# Patient Record
Sex: Male | Born: 1955 | Race: Black or African American | Hispanic: No | Smoking: Current every day smoker
Health system: Southern US, Community
[De-identification: ages and names within clinical notes are randomized; demographics above are authoritative.]

## PROBLEM LIST (undated history)

## (undated) DIAGNOSIS — I2510 Atherosclerotic heart disease of native coronary artery without angina pectoris: Secondary | ICD-10-CM

## (undated) DIAGNOSIS — I2109 ST elevation (STEMI) myocardial infarction involving other coronary artery of anterior wall: Secondary | ICD-10-CM

## (undated) DIAGNOSIS — E785 Hyperlipidemia, unspecified: Secondary | ICD-10-CM

## (undated) DIAGNOSIS — I10 Essential (primary) hypertension: Secondary | ICD-10-CM

## (undated) HISTORY — DX: ST elevation (STEMI) myocardial infarction involving other coronary artery of anterior wall: I21.09

## (undated) HISTORY — DX: Essential (primary) hypertension: I10

## (undated) HISTORY — PX: NO PAST SURGERIES: SHX2092

## (undated) HISTORY — DX: Atherosclerotic heart disease of native coronary artery without angina pectoris: I25.10

## (undated) HISTORY — DX: Hyperlipidemia, unspecified: E78.5

---

## 2010-11-06 ENCOUNTER — Inpatient Hospital Stay (HOSPITAL_COMMUNITY): Payer: Medicare Other

## 2010-11-06 ENCOUNTER — Inpatient Hospital Stay (HOSPITAL_COMMUNITY)
Admission: EM | Admit: 2010-11-06 | Discharge: 2010-11-10 | DRG: 237 | Disposition: A | Discharge status: Home / Self Care | Payer: Medicare Other | Attending: Interventional Cardiology | Admitting: Interventional Cardiology

## 2010-11-06 DIAGNOSIS — Z7982 Long term (current) use of aspirin: Secondary | ICD-10-CM

## 2010-11-06 DIAGNOSIS — I4901 Ventricular fibrillation: Secondary | ICD-10-CM | POA: Diagnosis not present

## 2010-11-06 DIAGNOSIS — E785 Hyperlipidemia, unspecified: Secondary | ICD-10-CM | POA: Diagnosis present

## 2010-11-06 DIAGNOSIS — J9600 Acute respiratory failure, unspecified whether with hypoxia or hypercapnia: Secondary | ICD-10-CM | POA: Diagnosis present

## 2010-11-06 DIAGNOSIS — S0993XA Unspecified injury of face, initial encounter: Secondary | ICD-10-CM | POA: Diagnosis present

## 2010-11-06 DIAGNOSIS — I2109 ST elevation (STEMI) myocardial infarction involving other coronary artery of anterior wall: Principal | ICD-10-CM | POA: Diagnosis present

## 2010-11-06 DIAGNOSIS — W19XXXA Unspecified fall, initial encounter: Secondary | ICD-10-CM | POA: Diagnosis present

## 2010-11-06 DIAGNOSIS — S199XXA Unspecified injury of neck, initial encounter: Secondary | ICD-10-CM | POA: Diagnosis present

## 2010-11-06 DIAGNOSIS — E46 Unspecified protein-calorie malnutrition: Secondary | ICD-10-CM | POA: Diagnosis not present

## 2010-11-06 DIAGNOSIS — R079 Chest pain, unspecified: Secondary | ICD-10-CM

## 2010-11-06 DIAGNOSIS — F17200 Nicotine dependence, unspecified, uncomplicated: Secondary | ICD-10-CM | POA: Diagnosis present

## 2010-11-06 DIAGNOSIS — R570 Cardiogenic shock: Secondary | ICD-10-CM | POA: Diagnosis present

## 2010-11-06 LAB — POCT I-STAT, CHEM 8
BUN: 6 mg/dL (ref 6–23)
Chloride: 103 mEq/L (ref 96–112)
Potassium: 3.4 mEq/L — ABNORMAL LOW (ref 3.5–5.1)
Sodium: 138 mEq/L (ref 135–145)

## 2010-11-06 LAB — BASIC METABOLIC PANEL
BUN: 7 mg/dL (ref 6–23)
CO2: 21 mEq/L (ref 19–32)
CO2: 21 mEq/L (ref 19–32)
Calcium: 8.6 mg/dL (ref 8.4–10.5)
Chloride: 105 mEq/L (ref 96–112)
Chloride: 105 mEq/L (ref 96–112)
Creatinine, Ser: 0.92 mg/dL (ref 0.50–1.35)
Glucose, Bld: 171 mg/dL — ABNORMAL HIGH (ref 70–99)
Glucose, Bld: 129 mg/dL — ABNORMAL HIGH (ref 70–99)
Potassium: 3.9 mEq/L (ref 3.5–5.1)
Potassium: 3.8 mEq/L (ref 3.5–5.1)
Sodium: 137 mEq/L (ref 135–145)

## 2010-11-06 LAB — RAPID URINE DRUG SCREEN, HOSP PERFORMED: Barbiturates: NOT DETECTED

## 2010-11-06 LAB — GLUCOSE, CAPILLARY
Glucose-Capillary: 117 mg/dL — ABNORMAL HIGH (ref 70–99)
Glucose-Capillary: 114 mg/dL — ABNORMAL HIGH (ref 70–99)

## 2010-11-06 LAB — CARDIAC PANEL(CRET KIN+CKTOT+MB+TROPI)
CK, MB: 52.5 ng/mL (ref 0.3–4.0)
Relative Index: 1.1 (ref 0.0–2.5)
Total CK: 3882 U/L — ABNORMAL HIGH (ref 7–232)
Troponin I: 17.3 ng/mL (ref <0.30)
Troponin I: >25 ng/mL (ref <0.30)

## 2010-11-06 LAB — POCT I-STAT 3, ART BLOOD GAS (G3+)
Acid-base deficit: 10 mmol/L — ABNORMAL HIGH (ref 0.0–2.0)
Bicarbonate: 16 mEq/L — ABNORMAL LOW (ref 20.0–24.0)
Bicarbonate: 18.1 mEq/L — ABNORMAL LOW (ref 20.0–24.0)
Bicarbonate: 22.7 mEq/L (ref 20.0–24.0)
O2 Saturation: 97 %
Patient temperature: 97.3
TCO2: 24 mmol/L (ref 0–100)
pH, Arterial: 7.221 — ABNORMAL LOW (ref 7.350–7.450)
pH, Arterial: 7.434 (ref 7.350–7.450)
pO2, Arterial: 103 mmHg — ABNORMAL HIGH (ref 80.0–100.0)
pO2, Arterial: 85 mmHg (ref 80.0–100.0)
pO2, Arterial: 375 mmHg — ABNORMAL HIGH (ref 80.0–100.0)

## 2010-11-06 LAB — CBC
HCT: 39.2 % (ref 39.0–52.0)
HCT: 40.1 % (ref 39.0–52.0)
Hemoglobin: 14.3 g/dL (ref 13.0–17.0)
Hemoglobin: 14.4 g/dL (ref 13.0–17.0)
MCH: 31.1 pg (ref 26.0–34.0)
MCHC: 36.5 g/dL — ABNORMAL HIGH (ref 30.0–36.0)
MCHC: 36.5 g/dL — ABNORMAL HIGH (ref 30.0–36.0)
MCV: 85.1 fL (ref 78.0–100.0)
MCV: 85.5 fL (ref 78.0–100.0)
Platelets: 333 10*3/uL (ref 150–400)
RBC: 4.63 MIL/uL (ref 4.22–5.81)
RBC: 4.69 MIL/uL (ref 4.22–5.81)
WBC: 11.7 10*3/uL — ABNORMAL HIGH (ref 4.0–10.5)

## 2010-11-06 LAB — HEPATIC FUNCTION PANEL
ALT: 63 U/L — ABNORMAL HIGH (ref 0–53)
AST: 171 U/L — ABNORMAL HIGH (ref 0–37)
Albumin: 3.7 g/dL (ref 3.5–5.2)
Alkaline Phosphatase: 66 U/L (ref 39–117)
Total Protein: 7.3 g/dL (ref 6.0–8.3)

## 2010-11-06 LAB — DIFFERENTIAL
Basophils Relative: 0 % (ref 0–1)
Lymphs Abs: 2.2 10*3/uL (ref 0.7–4.0)
Monocytes Absolute: 1 10*3/uL (ref 0.1–1.0)
Monocytes Relative: 8 % (ref 3–12)
Neutro Abs: 10 10*3/uL — ABNORMAL HIGH (ref 1.7–7.7)

## 2010-11-06 LAB — LIPID PANEL
Cholesterol: 181 mg/dL (ref 0–200)
HDL: 31 mg/dL — ABNORMAL LOW (ref >39)
LDL Cholesterol: 122 mg/dL — ABNORMAL HIGH (ref 0–99)
Total CHOL/HDL Ratio: 5.8 RATIO
Triglycerides: 140 mg/dL (ref <150)
VLDL: 28 mg/dL (ref 0–40)

## 2010-11-06 LAB — CK TOTAL AND CKMB (NOT AT ARMC): Total CK: 214 U/L (ref 7–232)

## 2010-11-06 LAB — PROTIME-INR: Prothrombin Time: 14.2 seconds (ref 11.6–15.2)

## 2010-11-06 LAB — ABO/RH: ABO/RH(D): B POS

## 2010-11-06 LAB — TYPE AND SCREEN

## 2010-11-06 LAB — MRSA PCR SCREENING: MRSA by PCR: NEGATIVE

## 2010-11-06 LAB — PHOSPHORUS: Phosphorus: 3.1 mg/dL (ref 2.3–4.6)

## 2010-11-06 LAB — LACTIC ACID, PLASMA: Lactic Acid, Venous: 1.3 mmol/L (ref 0.5–2.2)

## 2010-11-07 ENCOUNTER — Inpatient Hospital Stay (HOSPITAL_COMMUNITY): Payer: Medicare Other

## 2010-11-07 DIAGNOSIS — I219 Acute myocardial infarction, unspecified: Secondary | ICD-10-CM

## 2010-11-07 DIAGNOSIS — R570 Cardiogenic shock: Secondary | ICD-10-CM

## 2010-11-07 DIAGNOSIS — J9600 Acute respiratory failure, unspecified whether with hypoxia or hypercapnia: Secondary | ICD-10-CM

## 2010-11-07 DIAGNOSIS — E119 Type 2 diabetes mellitus without complications: Secondary | ICD-10-CM

## 2010-11-07 LAB — BASIC METABOLIC PANEL
BUN: 7 mg/dL (ref 6–23)
CO2: 22 mEq/L (ref 19–32)
Chloride: 107 mEq/L (ref 96–112)
Creatinine, Ser: 1.13 mg/dL (ref 0.50–1.35)
GFR calc Af Amer: >60 mL/min (ref >60)
Potassium: 3.2 mEq/L — ABNORMAL LOW (ref 3.5–5.1)

## 2010-11-07 LAB — GLUCOSE, CAPILLARY
Glucose-Capillary: 117 mg/dL — ABNORMAL HIGH (ref 70–99)
Glucose-Capillary: 121 mg/dL — ABNORMAL HIGH (ref 70–99)
Glucose-Capillary: 123 mg/dL — ABNORMAL HIGH (ref 70–99)
Glucose-Capillary: 104 mg/dL — ABNORMAL HIGH (ref 70–99)
Glucose-Capillary: 106 mg/dL — ABNORMAL HIGH (ref 70–99)
Glucose-Capillary: 87 mg/dL (ref 70–99)

## 2010-11-07 LAB — CBC
HCT: 37.3 % — ABNORMAL LOW (ref 39.0–52.0)
Hemoglobin: 13.6 g/dL (ref 13.0–17.0)
MCV: 85 fL (ref 78.0–100.0)
RBC: 4.39 MIL/uL (ref 4.22–5.81)
WBC: 12.8 10*3/uL — ABNORMAL HIGH (ref 4.0–10.5)

## 2010-11-07 LAB — CARDIAC PANEL(CRET KIN+CKTOT+MB+TROPI)
Relative Index: 0.6 (ref 0.0–2.5)
Troponin I: >25 ng/mL (ref <0.30)

## 2010-11-08 LAB — BASIC METABOLIC PANEL
CO2: 22 mEq/L (ref 19–32)
Chloride: 102 mEq/L (ref 96–112)
Creatinine, Ser: 1.09 mg/dL (ref 0.50–1.35)
Glucose, Bld: 90 mg/dL (ref 70–99)
Sodium: 134 mEq/L — ABNORMAL LOW (ref 135–145)

## 2010-11-08 LAB — CBC
Hemoglobin: 14.3 g/dL (ref 13.0–17.0)
MCV: 86.6 fL (ref 78.0–100.0)
Platelets: 279 10*3/uL (ref 150–400)
RBC: 4.62 MIL/uL (ref 4.22–5.81)
WBC: 11.2 10*3/uL — ABNORMAL HIGH (ref 4.0–10.5)

## 2010-11-09 LAB — BASIC METABOLIC PANEL
BUN: 9 mg/dL (ref 6–23)
CO2: 22 mEq/L (ref 19–32)
Glucose, Bld: 116 mg/dL — ABNORMAL HIGH (ref 70–99)
Potassium: 3.6 mEq/L (ref 3.5–5.1)
Sodium: 137 mEq/L (ref 135–145)

## 2010-11-11 NOTE — Cardiovascular Report (Signed)
NAMEMarland Kitchen  Scott Hobbs, Scott Hobbs NO.:  [id number]  MEDICAL RECORD NO.:  [id number]  LOCATION:  2909                         FACILITY:  [facility]  PHYSICIAN:  Corky Crafts, MDDATE OF BIRTH:  Jun 27, 1955  DATE OF PROCEDURE:  11/06/2010 DATE OF DISCHARGE:                           CARDIAC CATHETERIZATION   PROCEDURE PERFORMED:  Coronary angiogram, percutaneous coronary intervention of the left anterior descending, abdominal aortogram, and intraaortic balloon pump placement.  OPERATOR:  Corky Crafts, MD  INDICATIONS:  Anterior ST-elevation MI with cardiogenic shock and VF arrest.  PROCEDURE NARRATIVE:  The patient was brought emergently to the cath lab.  He was prepped and draped in the usual sterile fashion.  His right groin was infiltrated with 1% lidocaine.  A 6-French sheath was placed into the right common femoral artery using modified Seldinger technique. Right coronary angiography was performed using a JR-4.0 catheter.  The catheter was advanced to vessel ostium under fluoroscopic guidance. Digital angiography was performed in multiple projections using hand injection of contrast.  Left coronary artery angiography was performed using a CLS 3.5 guiding catheter in a similar fashion.  The angioplasty was performed.  Please see below for details.  An abdominal aortogram was performed after the left heart catheterization was performed using a pigtail catheter.  The pigtail catheter was pulled back from the left ventricle under continuous hemodynamic pressure monitoring.  After the abdominal aortogram and intra-aortic balloon pump was placed, the patient was then moved to the CCU.  A venous line was also placed for additional IV access.  FINDINGS:  The left main was widely patent. The right coronary artery was a medium-sized vessel and widely patent. The left circumflex was a large codominant vessel.  There was an OM-1 and OM-2 which were both large and all  appeared angiographically normal. The left anterior descending was occluded proximally.  After reperfusion, it was noted that there was a medium-sized diagonal and the remainder of the LAD was widely patent.  INTERVENTIONAL NARRATIVE:  A CLS 3.5 guiding catheter was used.  A [place] wire was placed down the LAD.  A 2.5 x 15 Emerge balloon was used to predilate the lesion.  Aspiration thrombectomy had been performed prior to the balloon dilatation.  Several passes were made with some improvement in the angiographic appearance.  A 3.0 x 23 Vision stent was then placed and deployed across the diseased area in the proximal LAD.  A 3.75 x 15 [place] Quantum balloon was then used to post dilate the stent, inflated to 20 atmospheres twice both in the distal and proximal part of the stent.  An intraaortic balloon pump was placed due to the fact that the patient had significant hemodynamic issues and rhythm issues.  Prior to the catheterization, the patient developed several episodes of ventricular fibrillation.  He was shocked 5 times.  He was not sedated at that point and he was trying to get up off the table every time he was shocked.  The final time he was shocked he fell off the table and had to be held back.  His C-spine was held and stabilized in place with a collar.  Ultimately, Anesthesia was called to  intubate the patient. Because of this, there was a delay in reperfusion.  He required high doses of sedation as well during the procedure.  He tried to move several times when sedation would wear off.  Eventually, Versed and fentanyl drip were started for sedation.  IMPRESSION: 1. Acute anterior ST-elevation myocardial infarction complicated by     cardiogenic shock and ventricular fibrillation arrest due to     occluded proximal left anterior descending. 2. Successful bare-metal stent placement to the proximal left anterior     descending with a 3.0 x 23 Vision stent postdilated to 3.9 mm  in     diameter. 3. Amiodarone drip due to the patient's arrhythmia.  RECOMMENDATIONS:  Continue intraaortic balloon pump at one-to-one.  He will need ventilatory support.  We will get a Pulmonary consult for that.  Continue aspirin and Effient.  We will continue Angiomax until he has got Effient bolus.  He will be watched in the CCU.  He will need aggressive secondary prevention and smoking cessation.     Corky Crafts, MD     JSV/MEDQ  D:  11/06/2010  T:  11/06/2010  Job:  [id number]  Electronically Signed by Lance Muss MD on 11/11/2010 01:09:09 PM

## 2010-11-11 NOTE — Discharge Summary (Signed)
  NAMEMarland Kitchen  Hobbs, Scott NO.:  [id number]  MEDICAL RECORD NO.:  [id number]  LOCATION:  2001                         FACILITY:  [facility]  PHYSICIAN:  Corky Crafts, MDDATE OF BIRTH:  03/20/1955  DATE OF ADMISSION:  11/06/2010 DATE OF DISCHARGE:  11/10/2010                              DISCHARGE SUMMARY   FINAL DIAGNOSES: 1. Acute anterior ST-elevation myocardial infarction. 2. Smoking. 3. Hyperlipidemia.  PROCEDURES PERFORMED:  Cardiac catheterization with stent placement to the LAD for acute anterior MI.  This was complicated by VF arrest.  The patient required intubation and subsequent balloon pump placement.  HOSPITAL COURSE:  The patient was admitted with chest discomfort.  Upon arrival to the cath lab, he had VF arrest.  He was shocked multiple times and eventually intubated.  We subsequently did his cardiac cath, which revealed an occluded LAD.  He was successfully stented.  He was watched in the ICU for several days.  He did well.  He was on IV amiodarone for sometime due to runs of nonsustained ventricular tachycardia.  After a few days his rhythm stabilized.  He had an echocardiogram showing an ejection fraction of 30% to 35% with corresponding wall motion abnormality to his occluded LAD.  He was not in any type of heart failure.  He was walking in the halls without any difficulty.  It was noted that because he is on Medicaid, it will be difficult for him to get Effient, therefore samples of Effient were given to him.  He also had a coupon for three 30-day supply.  Samples of Crestor were also given to the patient.  Of note while he was in the hospital, he also had a CT of his neck to clear his C-spine as he had fallen from the cath table prior to the procedure starting due to multiple defibrillation.  DISCHARGE MEDICATIONS: 1. Prasugrel 10 mg daily. 2. Rosuvastatin 40 mg daily. 3. Carvedilol 3.125 mg p.o. b.i.d. 4. Cepacol throat lozenges as  needed. 5. Aspirin 325 mg daily. 6. Lisinopril 5 mg p.o. daily.  ACTIVITY:  No lifting more than 10 pounds for at least a week.  No driving until he is seen in the office.  DIET:  Low-sodium heart-healthy diet.  Appointments with Dr. Eldridge Dace on November 14, 2010, at 9 a.m.  SPECIAL INSTRUCTIONS:  He is told to stop smoking.  Of note, he did admit to smoking marijuana, which he agrees to stop.  He will need lab work checked due to starting an ACE inhibitor for his LV dysfunction.     Corky Crafts, MD     JSV/MEDQ  D:  11/10/2010  T:  11/10/2010  Job:  [id number]  Electronically Signed by Lance Muss MD on 11/11/2010 01:10:22 PM

## 2010-12-09 NOTE — H&P (Signed)
NAMEMarland Kitchen  KEYVIN, RISON NO.:  [id number]  MEDICAL RECORD NO.:  [id number]  LOCATION:  2909                         FACILITY:  [facility]  PHYSICIAN:  Natasha Bence, MD       DATE OF BIRTH:  [birth year]  DATE OF ADMISSION:  11/06/2010 DATE OF DISCHARGE:                             HISTORY & PHYSICAL   CHIEF COMPLAINTS:  Chest pain.  HISTORY OF PRESENT ILLNESS:  The patient presented to the emergency department via EMS with acute chest pain, shortness of breath that began this evening.  History is limited as the patient is intubated.  The patient was found to have anterior ST elevation, taken to the catheterization lab where he was found to have an occluded proximal LAD prior.  As he has been transferred over for catheterization,  he had 6 episodes of ventricular fibrillation which were successfully defibrillated.  His LAD was stented with a bare metal stent.  Of note, after one of the defibrillation episodes, the patient was disoriented and slightly combative and fell off the catheterization table.  He fell on his elbow and bottom with no obvious head trauma.  REVIEW OF SYSTEMS:  Unable to be obtained as the patient is intubated.  PAST MEDICAL HISTORY:  Unknown.  PAST SURGICAL HISTORY:  Unknown.  FAMILY HISTORY:  Unknown.  SOCIAL HISTORY:  Unknown.  CURRENT MEDICATIONS:  Unknown.  ALLERGIES:  Unknown.  PHYSICAL EXAMINATION:  VITAL SIGNS:  He is afebrile.  Blood pressure 117/75, heart rate of 78, respiratory rate of 23, O2 saturations100%. GENERAL:  He is intubated, no apparent distress. EYES:  He has anicteric sclerae.  Pupils equal, round, reactive to light. HEAD:  Atraumatic. ENT:  Membranes are moist.  He has an ET tube in place. NECK:  Normal jugular venous pressure.  No carotid bruits. LUNGS:  Clear to auscultation bilaterally. CARDIOVASCULAR:  He has a regular rate and rhythm.  No murmurs, rubs, or gallops. ABDOMEN:  Soft, nontender,  nondistended. EXTREMITIES:  Slightly cool, symmetrical pulses throughout.  LABORATORY DATA:  Sodium was 138, potassium was 3.4, chloride was 114, BUN 6, creatinine was 1.5, glucose was 176.  Troponin was 0.36. Hematocrit was 40, white count was 11.7, platelet count was 330.  INR was 1.08.  PT was 14.2.  EKG initially showed sinus mechanism with anterolateral and inferior ST elevation.  IMPRESSION AND PLAN:  [age]-year-old black male with an anterior ST elevation myocardial infarction who presented in cardiogenic shock and ventricular fibrillation arrest.  He is currently hemodynamically stable status post a bare metal stent to his LAD.  He is currently intubated, but should be able to be extubated in the near future.  We will continue aspirin and prasugrel.  We will start him on a statin with Lipitor.  We will add beta-blocker and ACE inhibitor after his blood pressure has been stable for more time.  Currently, he is on amiodarone infusion due to his VF arrest.  We will likely be able to stop this and would need to replace his potassium as he is hypokalemic.  We will obtain an echocardiogram in the morning for further risk stratification.  Due to his fall from the cath  lab table, we will check x-rays to make sure he has not sustained any injuries there.          ______________________________ Natasha Bence, MD     MH/MEDQ  D:  11/06/2010  T:  11/06/2010  Job:  [id number]  Electronically Signed by Natasha Bence MD on 12/09/2010 11:40:58 AM

## 2012-11-12 ENCOUNTER — Other Ambulatory Visit: Payer: Self-pay | Admitting: Interventional Cardiology

## 2012-12-07 ENCOUNTER — Encounter: Payer: Self-pay | Admitting: *Deleted

## 2012-12-07 ENCOUNTER — Encounter: Payer: Self-pay | Admitting: Interventional Cardiology

## 2012-12-09 ENCOUNTER — Ambulatory Visit: Payer: Medicare Other | Admitting: Interventional Cardiology

## 2012-12-23 ENCOUNTER — Ambulatory Visit: Payer: Medicare Other | Admitting: Interventional Cardiology

## 2012-12-27 ENCOUNTER — Encounter: Payer: Self-pay | Admitting: Interventional Cardiology

## 2013-01-07 ENCOUNTER — Other Ambulatory Visit: Payer: Self-pay | Admitting: Interventional Cardiology

## 2013-01-16 ENCOUNTER — Other Ambulatory Visit: Payer: Self-pay | Admitting: Interventional Cardiology

## 2013-01-17 IMAGING — CT CT CERVICAL SPINE W/O CM
3 of 6 series · 9 of 28 positions shown, 11 images · non-contrast
Comparison: None available.

CT HEAD

CLINICAL DATA: Trauma to head.  Possible injury.

CT HEAD WITHOUT CONTRAST
CT CERVICAL SPINE WITHOUT CONTRAST
TECHNIQUE: Multidetector CT imaging of the head and cervical spine
was performed following the standard protocol without intravenous
contrast.  Multiplanar CT image reconstructions of the cervical
spine were also generated.

[Series 5: recon 2: c-spine · axial · 0.23mm/px · z∈[-253,-188]mm · 2 of 78 slices shown, 3 images]
[im 26/78  soft-tissue]
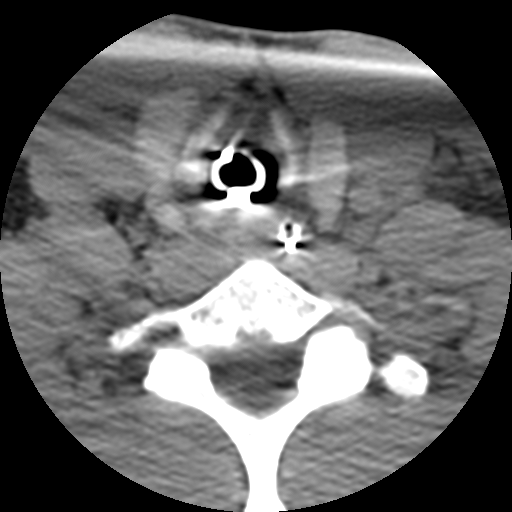
[im 26/78  bone]
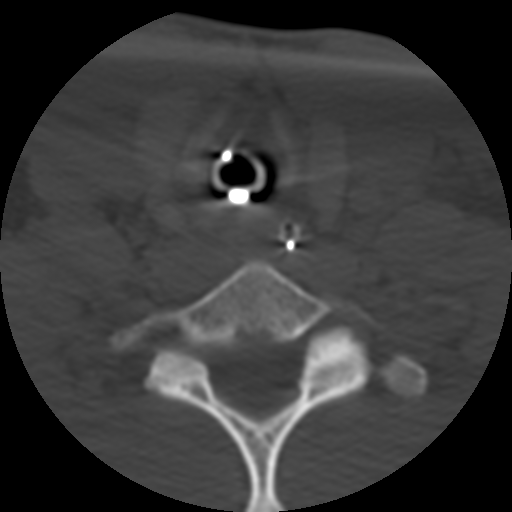
[im 52/78  bone]
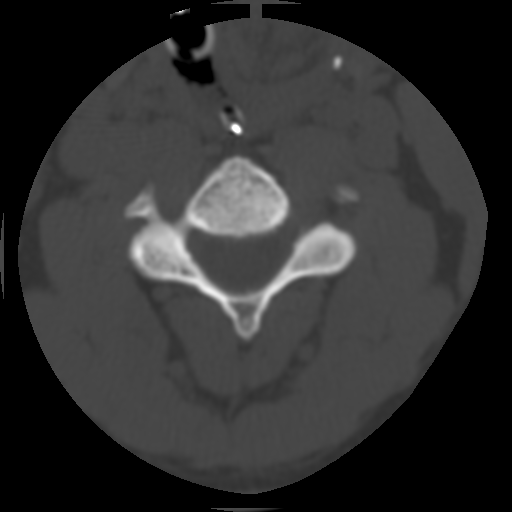

[Series 601: coronal · coronal · 0.43mm/px · 5 of 44 slices shown, 6 images]
[im 15/44  bone]
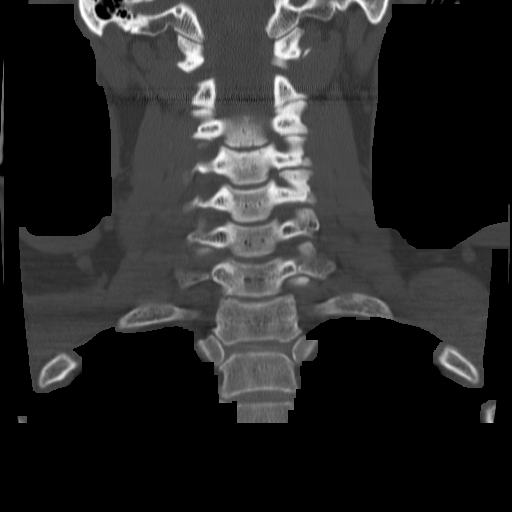
[im 18/44  bone]
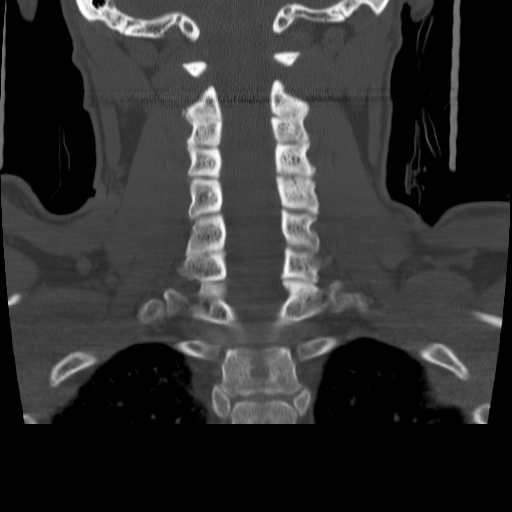
[im 22/44  soft-tissue]
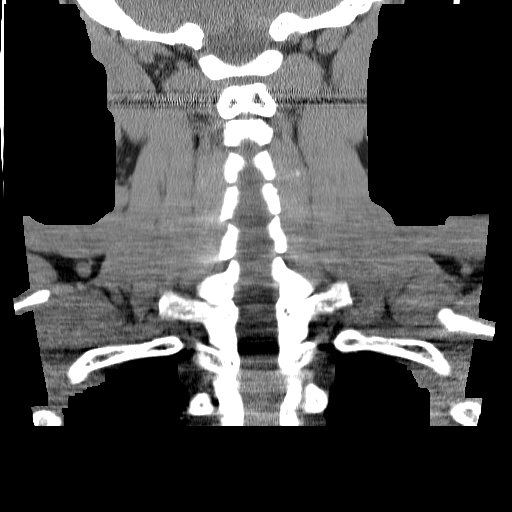
[im 22/44  bone]
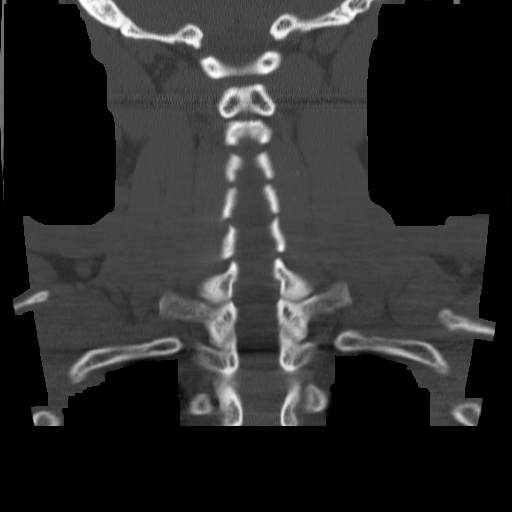
[im 26/44  bone]
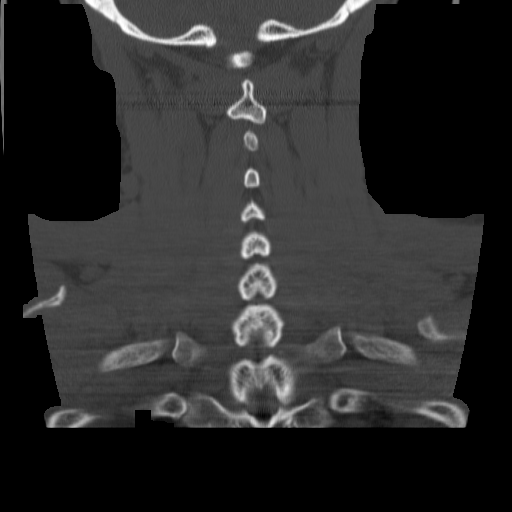
[im 29/44  bone]
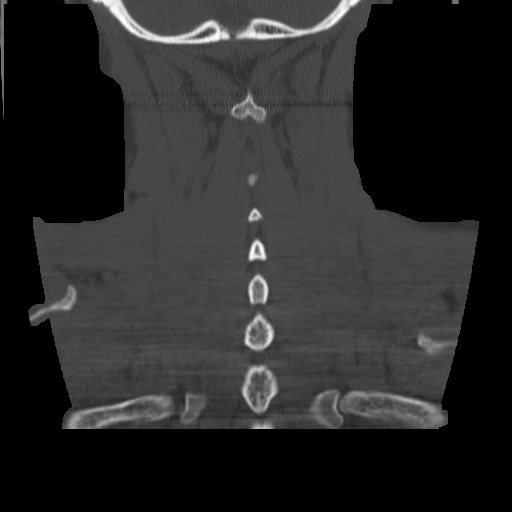

[Series 602: orthogonal · axial · 0.27mm/px · z∈[-259,-201]mm · 2 of 96 slices shown]
[im 32/96  bone]
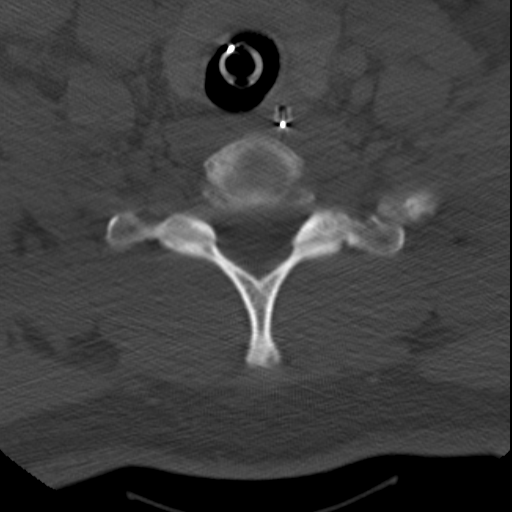
[im 64/96  bone]
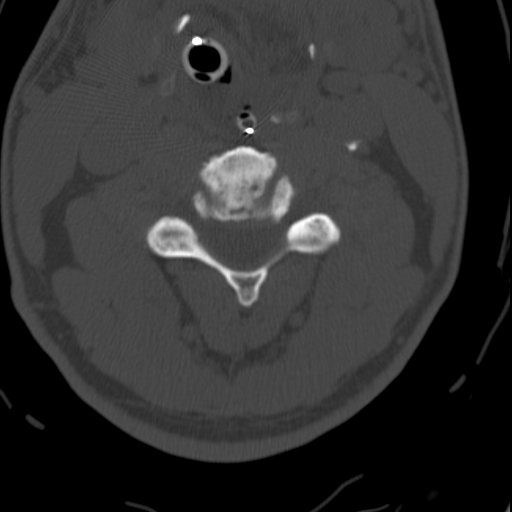

[9 of 28 positions shown; findings below may reference images not displayed]

FINDINGS: No acute cortical infarct, hemorrhage, or mass lesion is
present.  No significant extracranial soft tissue injury is
evident. The ventricles are of normal size.  No significant extra-
axial fluid collection is present.  The paranasal sinuses and
mastoid air cells are clear.  The osseous skull is intact.
Atherosclerotic calcifications are noted within the cavernous
carotid arteries.
IMPRESSION: Negative CT of the head.

CT CERVICAL SPINE
FINDINGS: The cervical spine is imaged from the skull base through
T2-3.  There is straightening of the normal cervical lordosis,
likely positional as the patient is in a hard collar.  The patient
is intubated.  An NG tube is in place.

No acute fracture or traumatic subluxation is evident.

Endplate sclerotic plate changes and anterior osteophytes are
prominent at C3-4.  There is uncovertebral disease at this level
with mild osseous foraminal narrowing, worse on the left.

C4-5:  Mild left-sided osseous foraminal narrowing is secondary to
facet hypertrophy.

No other significant osseous foraminal narrowing is evident.

The soft tissues are otherwise unremarkable.
IMPRESSION: 1.  No acute fracture or traumatic subluxation.
2.  Mild spondylosis of the cervical spine is most prominent at C3-
4 with mild bilateral foraminal narrowing and left-sided foraminal
narrowing at C4-5 secondary to facet hypertrophy.
3.  The patient is intubated with an NG tube in place. The hard
collar is in place as well.

## 2013-01-21 ENCOUNTER — Other Ambulatory Visit: Payer: Self-pay | Admitting: Interventional Cardiology

## 2013-03-23 ENCOUNTER — Other Ambulatory Visit: Payer: Self-pay | Admitting: Cardiology

## 2013-03-23 MED ORDER — CLOPIDOGREL BISULFATE 75 MG PO TABS: ORAL_TABLET | ORAL | Status: DC

## 2013-03-23 MED ORDER — CARVEDILOL 3.125 MG PO TABS: ORAL_TABLET | ORAL | Status: DC

## 2013-04-21 ENCOUNTER — Encounter: Payer: Self-pay | Admitting: Interventional Cardiology

## 2013-04-27 ENCOUNTER — Other Ambulatory Visit: Payer: Self-pay | Admitting: Cardiology

## 2013-04-27 MED ORDER — CARVEDILOL 3.125 MG PO TABS: ORAL_TABLET | ORAL | Status: DC

## 2013-04-27 MED ORDER — CLOPIDOGREL BISULFATE 75 MG PO TABS: ORAL_TABLET | ORAL | Status: DC

## 2013-04-27 NOTE — Addendum Note (Signed)
Addended byOrlene Plum: Kahleb Mcclane H on: 04/27/2013 08:41 AM   Modules accepted: Orders

## 2013-06-14 ENCOUNTER — Encounter: Payer: Self-pay | Admitting: Interventional Cardiology

## 2013-06-14 ENCOUNTER — Ambulatory Visit (INDEPENDENT_AMBULATORY_CARE_PROVIDER_SITE_OTHER): Payer: Medicare Other | Admitting: Interventional Cardiology

## 2013-06-14 ENCOUNTER — Encounter (INDEPENDENT_AMBULATORY_CARE_PROVIDER_SITE_OTHER): Payer: Self-pay

## 2013-06-14 VITALS — BP 119/77 | HR 91 | Ht 72.0 in | Wt 187.0 lb

## 2013-06-14 DIAGNOSIS — E782 Mixed hyperlipidemia: Secondary | ICD-10-CM

## 2013-06-14 DIAGNOSIS — I2510 Atherosclerotic heart disease of native coronary artery without angina pectoris: Secondary | ICD-10-CM | POA: Insufficient documentation

## 2013-06-14 DIAGNOSIS — I252 Old myocardial infarction: Secondary | ICD-10-CM | POA: Insufficient documentation

## 2013-06-14 DIAGNOSIS — F17200 Nicotine dependence, unspecified, uncomplicated: Secondary | ICD-10-CM | POA: Insufficient documentation

## 2013-06-14 MED ORDER — NITROGLYCERIN 0.4 MG SL SUBL: 0.4000 mg | SUBLINGUAL_TABLET | SUBLINGUAL | Status: DC | PRN

## 2013-06-14 MED ORDER — CARVEDILOL 3.125 MG PO TABS: ORAL_TABLET | ORAL | Status: DC

## 2013-06-14 NOTE — Progress Notes (Signed)
Patient ID: Scott Hobbs, male   DOB: 1956/01/19, 58 y.o.   MRN: [id number]    [address] [place], [place]  [address] Phone: [phone] Fax:  [phone]  Date:  06/14/2013   ID:  Scott Hobbs, DOB 1956/01/19, MRN [id number]  PCP:  No primary provider on file.      History of Present Illness: Scott Hobbs is a 58 y.o. male who had an anterior MI in 2012 complicated by VF arrest.No discomfort in his chest that was similar to what he had before his heart attack. It lasted 30 minutes while lying down and it resolved after sitting up and drinking some water. He did not use NTG. He exercises regularly. He was jogging 2 miles and then in the afternoon , he walks. No discomfort with exercising.  He had surgery on his ankle in 2013 for a ruptured blood vessel after trauma. [place]. No problems with the surgery.  [facility] He has improved. He continues to deny any cardiac symptoms. He is now riding a bike as well.    Wt Readings from Last 3 Encounters:  06/14/13 187 lb (84.823 kg)     Past Medical History  Diagnosis Date  . Hyperlipidemia   . Acute myocardial infarction of other anterior wall, subsequent episode of care   . HTN (hypertension)   . CAD (coronary artery disease)     Current Outpatient Prescriptions  Medication Sig Dispense Refill  . aspirin 325 MG tablet Take 325 mg by mouth daily.      Marland Kitchen. atorvastatin (LIPITOR) 40 MG tablet TAKE 1 TABLET BY MOUTH EVERY DAY *PT NEEDS APPT*  15 tablet  0  . carvedilol (COREG) 3.125 MG tablet TAKE 1 TABLET TWICE A DAY  60 tablet  1  . clopidogrel (PLAVIX) 75 MG tablet TAKE 1 TABLET BY MOUTH EVERY DAY  30 tablet  1  . lisinopril (PRINIVIL,ZESTRIL) 5 MG tablet TAKE 1 TABLET BY MOUTH EVERY DAY *PT NEEDS APPT*  15 tablet  0  . nitroGLYCERIN (NITROSTAT) 0.4 MG SL tablet Place 0.4 mg under the tongue every 5 (five) minutes as needed for chest pain.        No current facility-administered medications for this visit.    Allergies:   No Known Allergies  Social History:  The patient  reports that he has been smoking Cigarettes.  He has been smoking about 0.50 packs per day. He does not have any smokeless tobacco history on file. He reports that he drinks alcohol. He reports that he uses illicit drugs.   Family History:  The patient's family history is not on file.   ROS:  Please see the history of present illness.  No nausea, vomiting.  No fevers, chills.  No focal weakness.  No dysuria.    All other systems reviewed and negative.   PHYSICAL EXAM: VS:  BP 119/77  Pulse 91  Ht 6' (1.829 m)  Wt 187 lb (84.823 kg)  BMI 25.36 kg/m2 Well nourished, well developed, in no acute distress HEENT: normal Neck: no JVD, no carotid bruits Cardiac:  normal S1, S2; RRR;  Lungs:  clear to auscultation bilaterally, no wheezing, rhonchi or rales Abd: soft, nontender, no hepatomegaly Ext: no edema Skin: warm and dry Neuro:   no focal abnormalities noted  EKG:  Normal sinus rhythm, inferior Q waves, early repolarization, PAC, no change     ASSESSMENT  AND PLAN:  1.CAD in native artery  Start Nitroglycerin 0.4 mg tablet, 0.4 mg, 1 tablet as directed, SL, as directed prn chest pain, 1 vial, Refills 6 Continue Aspirin Tablet, 325 MG, 1 tablet, Orally, Once a day Continue Clopidogrel Bisulfate Tablet, 75 MG, 1 tablet, Orally, Once a day continue aggressive secondary prevention. He had a bare-metal stent to the LAD at the time of his acute anterior MI. He has been reliable with his Plavix. If a drug-eluting stent was needed in the future, I think he would be a good candidate.   Low risk stress test in July 2013. He had normal LV function. There was a mild defect in the basilar and mid inferoseptal/inferior wall. He has no symptoms. Angiography is not indicated.   2.Combined hyperlipidemia  Continue Atorvastatin Calcium Tablet, 40 MG, 1 tablet, Orally,  Once a day LDL well-controlled in 11/14, LDL 79. His HDL is only 29.  3. MI, Old  Continue Carvedilol Tablet, 3.125 MG, 1 tablet with food, Orally, Twice a day No evidence of congestive heart failure.   4.  tobacco abuse: He needs to stop smoking. Signed, Fredric MareJay S. Blair Lundeen, MD, [facility] 06/14/2013 3:43 PM

## 2013-06-14 NOTE — Patient Instructions (Signed)
Your physician recommends that you continue on your current medications as directed. Please refer to the Current Medication list given to you today.  Your physician wants you to follow-up in: 1 year with Dr. Varanasi. You will receive a reminder letter in the mail two months in advance. If you don't receive a letter, please call our office to schedule the follow-up appointment.  

## 2013-12-07 ENCOUNTER — Other Ambulatory Visit: Payer: Self-pay | Admitting: *Deleted

## 2013-12-07 MED ORDER — LISINOPRIL 5 MG PO TABS: ORAL_TABLET | ORAL | Status: DC

## 2013-12-07 MED ORDER — CARVEDILOL 3.125 MG PO TABS: ORAL_TABLET | ORAL | Status: DC

## 2013-12-07 MED ORDER — CLOPIDOGREL BISULFATE 75 MG PO TABS: ORAL_TABLET | ORAL | Status: DC

## 2013-12-07 MED ORDER — ATORVASTATIN CALCIUM 40 MG PO TABS: ORAL_TABLET | ORAL | Status: DC

## 2014-07-13 ENCOUNTER — Ambulatory Visit: Payer: Medicare Other | Admitting: Interventional Cardiology

## 2014-09-07 ENCOUNTER — Encounter: Payer: Self-pay | Admitting: Interventional Cardiology

## 2014-09-07 ENCOUNTER — Ambulatory Visit (INDEPENDENT_AMBULATORY_CARE_PROVIDER_SITE_OTHER): Payer: Medicare Other | Admitting: Interventional Cardiology

## 2014-09-07 VITALS — BP 144/88 | HR 61 | Ht 72.0 in | Wt 184.0 lb

## 2014-09-07 DIAGNOSIS — Z720 Tobacco use: Secondary | ICD-10-CM | POA: Diagnosis not present

## 2014-09-07 DIAGNOSIS — I2510 Atherosclerotic heart disease of native coronary artery without angina pectoris: Secondary | ICD-10-CM | POA: Diagnosis not present

## 2014-09-07 DIAGNOSIS — I10 Essential (primary) hypertension: Secondary | ICD-10-CM | POA: Diagnosis not present

## 2014-09-07 DIAGNOSIS — F17200 Nicotine dependence, unspecified, uncomplicated: Secondary | ICD-10-CM

## 2014-09-07 DIAGNOSIS — I252 Old myocardial infarction: Secondary | ICD-10-CM

## 2014-09-07 MED ORDER — LISINOPRIL 10 MG PO TABS: ORAL_TABLET | ORAL | Status: DC

## 2014-09-07 MED ORDER — ASPIRIN EC 81 MG PO TBEC: 81.0000 mg | DELAYED_RELEASE_TABLET | Freq: Every day | ORAL | Status: DC

## 2014-09-07 NOTE — Progress Notes (Signed)
Patient ID: Scott Hobbs, male   DOB: 08-May-1955, 59 y.o.   MRN: [id number]     Cardiology Office Note   Date:  09/07/2014   ID:  [name], DOB 09-28-1955, MRN [id number]  PCP:  Ailene Ravel, MD    No chief complaint on file.    Wt Readings from Last 3 Encounters:  09/07/14 184 lb (83.462 kg)  06/14/13 187 lb (84.823 kg)       History of Present Illness: Scott Hobbs is a 59 y.o. male  who had an anterior MI in 2012 complicated by VF arrest.  He received an LAD bare metal stent 3.0 x 23 mm at that time.  No discomfort in his chest that was similar to what he had before his heart attack.  He exercises regularly riding a bike 10 miles daily. No NTG use. No discomfort with exercising. No syncope.    Past Medical History  Diagnosis Date  . Hyperlipidemia   . Acute myocardial infarction of other anterior wall, subsequent episode of care   . HTN (hypertension)   . CAD (coronary artery disease)     Past Surgical History  Procedure Laterality Date  . No past surgeries       Current Outpatient Prescriptions  Medication Sig Dispense Refill  . aspirin 325 MG tablet Take 325 mg by mouth daily.    Marland Kitchen atorvastatin (LIPITOR) 40 MG tablet TAKE 1 TABLET BY MOUTH EVERY DAY 30 tablet 11  . carvedilol (COREG) 3.125 MG tablet TAKE 1 TABLET TWICE A DAY 60 tablet 11  . clopidogrel (PLAVIX) 75 MG tablet TAKE 1 TABLET BY MOUTH EVERY DAY 30 tablet 11  . INVEGA SUSTENNA 117 MG/0.75ML SUSP     . lisinopril (PRINIVIL,ZESTRIL) 5 MG tablet TAKE 1 TABLET BY MOUTH EVERY DAY 30 tablet 11  . nitroGLYCERIN (NITROSTAT) 0.4 MG SL tablet Place 1 tablet (0.4 mg total) under the tongue every 5 (five) minutes as needed for chest pain. 25 tablet 5  . traZODone (DESYREL) 100 MG tablet TAKE ONE (1) TABLET AT BEDTIME  2   No current facility-administered medications for this visit.    Allergies:   Review of patient's allergies indicates no known allergies.    Social History:  The patient  reports that  he has been smoking Cigarettes.  He has been smoking about 0.50 packs per day. He does not have any smokeless tobacco history on file. He reports that he drinks alcohol. He reports that he uses illicit drugs.   Family History:  The patient's family history includes Cancer in his mother; Diabetes in his father and sister; Heart Problems in his father; Hypertension in his father, sister, and sister; Stroke in his father.    ROS:  Please see the history of present illness.   Otherwise, review of systems are positive for .   All other systems are reviewed and negative.    PHYSICAL EXAM: VS:  BP 144/88 mmHg  Pulse 61  Ht 6' (1.829 m)  Wt 184 lb (83.462 kg)  BMI 24.95 kg/m2 , BMI Body mass index is 24.95 kg/(m^2). GEN: Well nourished, well developed, in no acute distress HEENT: normal Neck: no JVD, carotid bruits, or masses Cardiac: RRR; no murmurs, rubs, or gallops,no edema  Respiratory:  clear to auscultation bilaterally, normal work of breathing GI: soft, nontender, nondistended, + BS MS: no deformity or atrophy Skin: warm and dry, no rash Neuro:  Strength and sensation are intact Psych: euthymic mood, full affect   EKG:  The ekg ordered today demonstrates NSR, non specific ST segment changes   Recent Labs: No results found for requested labs within last 365 days.   Lipid Panel    Component Value Date/Time   CHOL 181 11/06/2010 0300   TRIG 140 11/06/2010 0300   HDL 31* 11/06/2010 0300   CHOLHDL 5.8 11/06/2010 0300   VLDL 28 11/06/2010 0300   LDLCALC 122* 11/06/2010 0300     Other studies Reviewed: Additional studies/ records that were reviewed today with results demonstrating: no change from 2015 ECG.  2012 cath report reviewed.   ASSESSMENT AND PLAN:  1. CAD/Old MI: No CHF. COntinuing DAPT.  Can decrease aspirin to 81 mg daily.   2. HTN: BP has been high at home as well.  Increase lisinopril to 10 mg daily.   Tobacco abuse:   The patient was counseled on the  dangers of tobacco use, both inhaled and oral, which include, but are not limited to cardiovascular disease, increased cancer risk of multiple types of cancer, COPD, peripheral vascular disease, strokes. He was also counseled on the benefits of smoking cessation. The patient was firmly advised to quit.    We also reviewed strategies to maximize success, including: Removing cigarettes and smoking materials from environment Stress management Substitution of other forms of reinforcement Support of family/friends. Selecting a quit date. Patient provided contact information for 1-800-QUIT-NOW  Let us know if he wants to try nicotine patches or gum.   5 minutes spent discussing smoking cessation.  Current medicines are reviewed at length with the patient today.  The patient concerns regarding his medicines were addressed.  The following changes have been made:  As above  Labs/ tests ordered today include: none No orders of the defined types were placed in this encounter.    Recommend 150 minutes/week of aerobic exercise; continue bike riding.  He should wear a helmet. Low fat, low carb, high fiber diet recommended  Disposition:   FU in 1 year   Delorise Jackson., MD  09/07/2014 3:35 PM    [facility] [address] [place], [place], [place]  [address] Phone: [phone]; Fax: [phone]

## 2014-09-07 NOTE — Patient Instructions (Addendum)
Medication Instructions:  Increase Lisinopril to 10 mg daily and decrease Aspirin to 81 mg daily  Labwork: None  Testing/Procedures: None  Follow-Up: Your physician wants you to follow-up in: 1 year. You will receive a reminder letter in the mail two months in advance. If you don't receive a letter, please call our office to schedule the follow-up appointment.

## 2015-09-09 NOTE — Progress Notes (Deleted)
Patient ID: Scott Hobbs, male   DOB: [birth year], 60 y.o.   MRN: [id number]     Cardiology Office Note   Date:  09/09/2015   ID:  Scott Hobbs, DOB 1955/04/09, MRN [id number]  PCP:  Ailene Ravel, MD    No chief complaint on file.    Wt Readings from Last 3 Encounters:  09/07/14 184 lb (83.5 kg)  06/14/13 187 lb (84.8 kg)       History of Present Illness: Scott Hobbs is a 60 y.o. male  who had an anterior MI in 2012 complicated by VF arrest.  He received an LAD bare metal stent 3.0 x 23 mm at that time.  No discomfort in his chest that was similar to what he had before his heart attack.  He exercises regularly riding a bike 10 miles daily. No NTG use. No discomfort with exercising. No syncope.    Past Medical History:  Diagnosis Date  . Acute myocardial infarction of other anterior wall, subsequent episode of care   . CAD (coronary artery disease)   . HTN (hypertension)   . Hyperlipidemia     Past Surgical History:  Procedure Laterality Date  . NO PAST SURGERIES       Current Outpatient Prescriptions  Medication Sig Dispense Refill  . aspirin EC 81 MG tablet Take 1 tablet (81 mg total) by mouth daily. 90 tablet 3  . atorvastatin (LIPITOR) 40 MG tablet TAKE 1 TABLET BY MOUTH EVERY DAY 30 tablet 11  . carvedilol (COREG) 3.125 MG tablet TAKE 1 TABLET TWICE A DAY 60 tablet 11  . clopidogrel (PLAVIX) 75 MG tablet TAKE 1 TABLET BY MOUTH EVERY DAY 30 tablet 11  . INVEGA SUSTENNA 117 MG/0.75ML SUSP     . lisinopril (PRINIVIL,ZESTRIL) 10 MG tablet TAKE 1 TABLET BY MOUTH EVERY DAY 30 tablet 11  . nitroGLYCERIN (NITROSTAT) 0.4 MG SL tablet Place 1 tablet (0.4 mg total) under the tongue every 5 (five) minutes as needed for chest pain. 25 tablet 5  . traZODone (DESYREL) 100 MG tablet TAKE ONE (1) TABLET AT BEDTIME  2   No current facility-administered medications for this visit.     Allergies:   Review of patient's allergies indicates no known allergies.    Social History:   The patient  reports that he has been smoking Cigarettes.  He has been smoking about 0.50 packs per day. He does not have any smokeless tobacco history on file. He reports that he drinks alcohol. He reports that he uses drugs.   Family History:  The patient's family history includes Cancer in his mother; Diabetes in his father and sister; Heart Problems in his father; Hypertension in his father, sister, and sister; Stroke in his father.    ROS:  Please see the history of present illness.   Otherwise, review of systems are positive for .   All other systems are reviewed and negative.    PHYSICAL EXAM: VS:  There were no vitals taken for this visit. , BMI There is no height or weight on file to calculate BMI. GEN: Well nourished, well developed, in no acute distress HEENT: normal Neck: no JVD, carotid bruits, or masses Cardiac: RRR; no murmurs, rubs, or gallops,no edema  Respiratory:  clear to auscultation bilaterally, normal work of breathing GI: soft, nontender, nondistended, + BS MS: no deformity or atrophy Skin: warm and dry, no rash Neuro:  Strength and sensation are intact Psych: euthymic mood, full affect   EKG:   The  ekg ordered today demonstrates NSR, non specific ST segment changes   Recent Labs: No results found for requested labs within last 8760 hours.   Lipid Panel    Component Value Date/Time   CHOL 181 11/06/2010 0300   TRIG 140 11/06/2010 0300   HDL 31 (L) 11/06/2010 0300   CHOLHDL 5.8 11/06/2010 0300   VLDL 28 11/06/2010 0300   LDLCALC 122 (H) 11/06/2010 0300     Other studies Reviewed: Additional studies/ records that were reviewed today with results demonstrating: no change from 2015 ECG.  2012 cath report reviewed.   ASSESSMENT AND PLAN:  1. CAD/Old MI: No CHF. COntinuing DAPT.  Can decrease aspirin to 81 mg daily.   2. HTN: BP has been high at home as well.  Increase lisinopril to 10 mg daily.   Tobacco abuse:   The patient was counseled on the  dangers of tobacco use, both inhaled and oral, which include, but are not limited to cardiovascular disease, increased cancer risk of multiple types of cancer, COPD, peripheral vascular disease, strokes. He was also counseled on the benefits of smoking cessation. The patient was firmly advised to quit.    We also reviewed strategies to maximize success, including: Removing cigarettes and smoking materials from environment Stress management Substitution of other forms of reinforcement Support of family/friends. Selecting a quit date. Patient provided contact information for 1-800-QUIT-NOW  Let us know if he wants to try nicotine patches or gum.   5 minutes spent discussing smoking cessation.  Current medicines are reviewed at length with the patient today.  The patient concerns regarding his medicines were addressed.  The following changes have been made:  As above  Labs/ tests ordered today include: none No orders of the defined types were placed in this encounter.   Recommend 150 minutes/week of aerobic exercise; continue bike riding.  He should wear a helmet. Low fat, low carb, high fiber diet recommended  Disposition:   FU in 1 year   Signed, Lance Muss, MD  09/09/2015 7:28 PM    [facility] [address] [place], [place], [place]  [address] Phone: [phone]; Fax: [phone]

## 2015-09-10 ENCOUNTER — Ambulatory Visit: Payer: Medicare Other | Admitting: Interventional Cardiology

## 2015-09-25 ENCOUNTER — Ambulatory Visit (INDEPENDENT_AMBULATORY_CARE_PROVIDER_SITE_OTHER): Payer: Medicare Other | Admitting: Cardiology

## 2015-09-25 ENCOUNTER — Encounter: Payer: Self-pay | Admitting: Cardiology

## 2015-09-25 ENCOUNTER — Encounter (INDEPENDENT_AMBULATORY_CARE_PROVIDER_SITE_OTHER): Payer: Self-pay

## 2015-09-25 VITALS — BP 100/60 | HR 64 | Ht 72.0 in | Wt 181.1 lb

## 2015-09-25 DIAGNOSIS — Z5181 Encounter for therapeutic drug level monitoring: Secondary | ICD-10-CM | POA: Diagnosis not present

## 2015-09-25 DIAGNOSIS — E785 Hyperlipidemia, unspecified: Secondary | ICD-10-CM | POA: Diagnosis not present

## 2015-09-25 DIAGNOSIS — I2510 Atherosclerotic heart disease of native coronary artery without angina pectoris: Secondary | ICD-10-CM | POA: Diagnosis not present

## 2015-09-25 LAB — CBC
HCT: 40.8 % (ref 38.5–50.0)
HEMOGLOBIN: 14.5 g/dL (ref 13.2–17.1)
MCH: 30.9 pg (ref 27.0–33.0)
MCHC: 35.5 g/dL (ref 32.0–36.0)
MCV: 87 fL (ref 80.0–100.0)
MPV: 9.3 fL (ref 7.5–12.5)
PLATELETS: 317 10*3/uL (ref 140–400)
RBC: 4.69 MIL/uL (ref 4.20–5.80)
RDW: 14.2 % (ref 11.0–15.0)
WBC: 8.4 10*3/uL (ref 3.8–10.8)

## 2015-09-25 LAB — COMPREHENSIVE METABOLIC PANEL
ALBUMIN: 4.3 g/dL (ref 3.6–5.1)
ALK PHOS: 70 U/L (ref 40–115)
ALT: 16 U/L (ref 9–46)
AST: 12 U/L (ref 10–35)
BUN: 7 mg/dL (ref 7–25)
CHLORIDE: 104 mmol/L (ref 98–110)
CO2: 24 mmol/L (ref 20–31)
Calcium: 9.7 mg/dL (ref 8.6–10.3)
Creat: 1.23 mg/dL (ref 0.70–1.33)
Glucose, Bld: 92 mg/dL (ref 65–99)
POTASSIUM: 4.4 mmol/L (ref 3.5–5.3)
Sodium: 138 mmol/L (ref 135–146)
TOTAL PROTEIN: 7.3 g/dL (ref 6.1–8.1)
Total Bilirubin: 0.4 mg/dL (ref 0.2–1.2)

## 2015-09-25 LAB — LIPID PANEL
CHOL/HDL RATIO: 5.2 ratio — AB (ref <=5.0)
CHOLESTEROL: 206 mg/dL — AB (ref 125–200)
HDL: 40 mg/dL (ref >=40)
LDL Cholesterol: 146 mg/dL — ABNORMAL HIGH (ref <130)
Triglycerides: 99 mg/dL (ref <150)
VLDL: 20 mg/dL (ref <30)

## 2015-09-25 NOTE — Progress Notes (Signed)
09/25/2015 Scott JuneauJames Hanisch   1956-01-15  [id number]  Primary Physician Ailene RavelHAMRICK,MAURA L, MD Primary Cardiologist: Dr. Eldridge DaceVaranasi   Reason for Visit/CC: 1 year f/u for CAD  HPI:  Scott Hobbs is a 60 y.o. male  who had an anterior MI in 2012 complicated by VF arrest.  He received an LAD bare metal stent 3.0 x 23 mm at that time. Says a history of hypertension and tobacco abuse. At a nuclear stress test in 2013 which demonstrated possible mild basal paraseptal and mild inferior ischemia  His last echocardiogram also 2013 revealed normal left ventricle systolic function with estimated ejection fraction of 55-60%.  He presents to clinic today for 1 year follow-up. He he is accompanied by daughter. He reports that he is has done well since his last office visit. He denies any anginal symptoms. No exertional chest pain. No exertional dyspnea. He walks 3 miles a day for exercise without limitations. Unfortunately, he continues to smoke daily, on average 4 cigarettes a day. He reports full medication compliance. He denies any issues with orthopnea, PND, lower extremity edema, palpitations, lightheadedness, dizziness, syncope/near-syncope. He denies any unwanted side effects with his medications including no myalgias/arthralgias. No abnormal bleeding with aspirin and Plavix. EKG today in clinic shows normal sinus rhythm without ischemia. Heart rate 64 bpm. Blood pressure is well controlled at 100/60.    Current Outpatient Prescriptions  Medication Sig Dispense Refill  . aspirin EC 81 MG tablet Take 1 tablet (81 mg total) by mouth daily. 90 tablet 3  . atorvastatin (LIPITOR) 40 MG tablet TAKE 1 TABLET BY MOUTH EVERY DAY 30 tablet 11  . carvedilol (COREG) 3.125 MG tablet TAKE 1 TABLET TWICE A DAY (Patient taking differently: Take 3.125 mg by mouth 2 (two) times daily with a meal. TAKE 1 TABLET TWICE A DAY) 60 tablet 11  . clopidogrel (PLAVIX) 75 MG tablet TAKE 1 TABLET BY MOUTH EVERY DAY 30 tablet 11  .  lisinopril (PRINIVIL,ZESTRIL) 10 MG tablet TAKE 1 TABLET BY MOUTH EVERY DAY 30 tablet 11  . nitroGLYCERIN (NITROSTAT) 0.4 MG SL tablet Place 1 tablet (0.4 mg total) under the tongue every 5 (five) minutes as needed for chest pain. 25 tablet 5  . traZODone (DESYREL) 100 MG tablet TAKE ONE (1) TABLET AT BEDTIME  2   No current facility-administered medications for this visit.     No Known Allergies  Social History   Social History  . Marital status: Unknown    Spouse name: N/A  . Number of children: N/A  . Years of education: N/A   Occupational History  . Not on file.   Social History Main Topics  . Smoking status: Current Every Day Smoker    Packs/day: 0.50    Types: Cigarettes  . Smokeless tobacco: Current User  . Alcohol use Yes  . Drug use:   . Sexual activity: Not on file   Other Topics Concern  . Not on file   Social History Narrative  . No narrative on file     Review of Systems: General: negative for chills, fever, night sweats or weight changes.  Cardiovascular: negative for chest pain, dyspnea on exertion, edema, orthopnea, palpitations, paroxysmal nocturnal dyspnea or shortness of breath Dermatological: negative for rash Respiratory: negative for cough or wheezing Urologic: negative for hematuria Abdominal: negative for nausea, vomiting, diarrhea, bright red blood per rectum, melena, or hematemesis Neurologic: negative for visual changes, syncope, or dizziness All other systems reviewed and are otherwise negative except as noted  above.    Height 6' (1.829 m), weight 181 lb 1.9 oz (82.2 kg).  General appearance: alert, cooperative and no distress Neck: no carotid bruit and no JVD Lungs: clear to auscultation bilaterally Heart: regular rate and rhythm, S1, S2 normal, no murmur, click, rub or gallop Pulses: 2+ and symmetric Skin: Skin color, texture, turgor normal. No rashes or lesions Neurologic: Grossly normal  EKG NSR. No ischemia.   ASSESSMENT AND  PLAN:   1. CAD: Status post myocardial infarction complicated by VF arrest in 2012, treated with PCI to the LAD. He denies any anginal symptomatology. No exertional dyspnea nor chest pain. EKG shows normal sinus rhythm without ischemia. Continue medical therapy for secondary prevention with aspirin, Plavix, carvedilol, lisinopril and Lipitor.  2. Hypertension: Blood pressure is well controlled on current regimen.  3. Hyperlipidemia: Continue statin therapy with Lipitor. He denies any side effects of arthralgias/myalgias. He has been fasting. We will obtain a fasting lipid panel today as well as hepatic function tests.  4.  Tobacco abuse: Smoking cessation strongly advised.   PLAN  F/u with Dr. Eldridge DaceVaranasi in 1 year.   Callaghan Laverdure PA-C 09/25/2015 11:07 AM

## 2015-09-25 NOTE — Patient Instructions (Signed)
Medication Instructions:   Your physician recommends that you continue on your current medications as directed. Please refer to the Current Medication list given to you today.   If you need a refill on your cardiac medications before your next appointment, please call your pharmacy.  Labwork: LIPID CBC AND CMP    Testing/Procedures: NONE ORDER TODAY '   Follow-Up: Your physician wants you to follow-up in: ONE YEAR WITH VARANASI You will receive a reminder letter in the mail two months in advance. If you don't receive a letter, please call our office to schedule the follow-up appointment.     Any Other Special Instructions Will Be Listed Below (If Applicable).

## 2015-10-01 ENCOUNTER — Telehealth: Payer: Self-pay | Admitting: Interventional Cardiology

## 2015-10-01 DIAGNOSIS — E785 Hyperlipidemia, unspecified: Secondary | ICD-10-CM

## 2015-10-01 MED ORDER — ATORVASTATIN CALCIUM 40 MG PO TABS: 40.0000 mg | ORAL_TABLET | Freq: Every day | ORAL | 11 refills | Status: DC

## 2015-10-01 NOTE — Telephone Encounter (Signed)
Follow Up: ° ° °Returning Scott Hobbs's call from yesterday. °

## 2015-10-01 NOTE — Telephone Encounter (Signed)
Pt is aware of lab results and MD's recommendations. Pt's LDL is 146 pt states has not been taking the Atorvastatin 40 mg once daily because he does not have any medication . A prescription for Atorvastatin 40 mg one daily dispense 30 tablets and refills sent to [facility] in [place]. Pt is aware.

## 2015-10-02 NOTE — Telephone Encounter (Signed)
Recheck CMet/lipids in 3 months.

## 2015-10-02 NOTE — Telephone Encounter (Signed)
LMTCB

## 2015-10-03 ENCOUNTER — Telehealth: Payer: Self-pay | Admitting: Interventional Cardiology

## 2015-10-03 NOTE — Telephone Encounter (Addendum)
**Note De-identified Zierra Laroque Obfuscation**  **Note De-Identified English Craighead Obfuscation** FYI: the pt also has lab results. Please advise the pt if he calls while Im out. LMTCB.

## 2015-10-03 NOTE — Telephone Encounter (Signed)
See phone note from 10/01/15.

## 2015-10-03 NOTE — Telephone Encounter (Signed)
New Message  Pt voiced he is returning nurses call about lab results.  Please follow up with pt. Thanks!

## 2015-10-03 NOTE — Telephone Encounter (Signed)
**Note De-Identified Scott Hobbs Obfuscation** The pts sister is advised and she scheduled an apt for the pt to have his CMET and Lipids drawn on Nov. 8. She states that she will advise the pt to be NPO the night prior to lab work.  CMET/Lipids have been ordered and scheduled to be drawn on 11/8.

## 2016-01-01 ENCOUNTER — Other Ambulatory Visit: Payer: Medicare Other

## 2016-01-03 ENCOUNTER — Other Ambulatory Visit: Payer: Medicare Other

## 2016-09-27 NOTE — Progress Notes (Signed)
Cardiology Office Note   Date:  09/28/2016   ID:  Scott Hobbs, DOB 1955/09/02, MRN [id number]  PCP:  Scott Ravel, MD    No chief complaint on file. CAD/MI   Wt Readings from Last 3 Encounters:  09/28/16 199 lb 6.4 oz (90.4 kg)  09/25/15 181 lb 1.9 oz (82.2 kg)  09/07/14 184 lb (83.5 kg)       History of Present Illness: Scott Hobbs is a 61 y.o. male  who had an anterior MI in 2012 complicated by VF arrest. He received an LAD bare metal stent 3.0 x 23 mm at that time. Says a history of hypertension and tobacco abuse. At a nuclear stress test in 2013 which demonstrated possible mild basal paraseptal and mild inferior ischemia  His last echocardiogram also 2013 revealed normal left ventricular systolic function with estimated ejection fraction of 55-60%.  Denies : Chest pain. Dizziness. Leg edema. Nitroglycerin use. Orthopnea. Palpitations. Paroxysmal nocturnal dyspnea. Shortness of breath. Syncope.   Walks 2 miles a day.  Takes about 30 minutes.  He checks BP at home and readings are normal.  He checks about once a month.   He has gained weight since last year.      Past Medical History:  Diagnosis Date  . Acute myocardial infarction of other anterior wall, subsequent episode of care   . CAD (coronary artery disease)   . HTN (hypertension)   . Hyperlipidemia     Past Surgical History:  Procedure Laterality Date  . NO PAST SURGERIES       Current Outpatient Prescriptions  Medication Sig Dispense Refill  . aspirin EC 81 MG tablet Take 1 tablet (81 mg total) by mouth daily. 90 tablet 3  . carvedilol (COREG) 3.125 MG tablet TAKE 1 TABLET TWICE A DAY (Patient taking differently: Take 3.125 mg by mouth 2 (two) times daily with a meal. TAKE 1 TABLET TWICE A DAY) 60 tablet 11  . clopidogrel (PLAVIX) 75 MG tablet TAKE 1 TABLET BY MOUTH EVERY DAY 30 tablet 11  . lisinopril (PRINIVIL,ZESTRIL) 10 MG tablet TAKE 1 TABLET BY MOUTH EVERY DAY 30 tablet 11  .  nitroGLYCERIN (NITROSTAT) 0.4 MG SL tablet Place 1 tablet (0.4 mg total) under the tongue every 5 (five) minutes as needed for chest pain. 25 tablet 5  . traZODone (DESYREL) 100 MG tablet TAKE ONE (1) TABLET AT BEDTIME  2  . atorvastatin (LIPITOR) 40 MG tablet Take 1 tablet (40 mg total) by mouth daily. 30 tablet 11   No current facility-administered medications for this visit.     Allergies:   Patient has no known allergies.    Social History:  The patient  reports that he has been smoking Cigarettes.  He has been smoking about 0.50 packs per day. He uses smokeless tobacco. He reports that he drinks alcohol. He reports that he uses drugs.   Family History:  The patient's family history includes Cancer in his mother; Diabetes in his father and sister; Heart Problems in his father; Hypertension in his father, sister, and sister; Stroke in his father.    ROS:  Please see the history of present illness.   No complaints.   All other systems are reviewed and negative.    PHYSICAL EXAM: VS:  BP (!) 140/92   Pulse (!) 55   Ht 6' (1.829 m)   Wt 199 lb 6.4 oz (90.4 kg)   SpO2 98%   BMI 27.04 kg/m  , BMI Body  mass index is 27.04 kg/m. GEN: Well nourished, well developed, in no acute distress  HEENT: tongue movements, like tardive dyskinesia Neck: no JVD, carotid bruits, or masses Cardiac: bradycardic, S1S2; no murmurs, rubs, or gallops,no edema , 2+ PT pulese bilaterally Respiratory:  clear to auscultation bilaterally, normal work of breathing GI: soft, nontender, nondistended, + BS, no pulsatile mass noted MS: no deformity or atrophy  Skin: warm and dry, no rash Neuro:  Strength and sensation are intact Psych: euthymic mood, full affect   EKG:   The ekg ordered today demonstrates SB, no ST changes   Recent Labs: No results found for requested labs within last 8760 hours.   Lipid Panel    Component Value Date/Time   CHOL 206 (H) 09/25/2015 1138   TRIG 99 09/25/2015 1138   HDL  40 09/25/2015 1138   CHOLHDL 5.2 (H) 09/25/2015 1138   VLDL 20 09/25/2015 1138   LDLCALC 146 (H) 09/25/2015 1138     Other studies Reviewed: Additional studies/ records that were reviewed today with results demonstrating: Cath results from 2012, labs from 2017 as well.   ASSESSMENT AND PLAN:  1. CAD: s/p MI with VF arrest in 2012.  He had BMS to the LAD.  He has gained weight.  He eats a lot of carbs, cookies.  Cut back on the sugar intake will help him lose weight. No angina on current medical therapy. 2. HTN: Controlled at home.  Continue current meds.  If BP increases, would increase ACE-I.  HR too slow to increase beta blocker.  Better controlled at a prior visit.  High reading today may be related to weight gain.   3. Hyperlipidemia: LDL increase last year.  He is taking the atorvastatin daily.  Labs today. 4. Tobacco abuse: One pack / week.  He is not interested in any type of aid to stop smoking.  He can do it "on my own."   Current medicines are reviewed at length with the patient today.  The patient concerns regarding his medicines were addressed.  The following changes have been made:  No change  Labs/ tests ordered today include:  No orders of the defined types were placed in this encounter.   Recommend 150 minutes/week of aerobic exercise Low fat, low carb, high fiber diet recommended  Disposition:   FU in 1 year   Signed, Lance Muss, MD  09/28/2016 9:47 AM    [facility] [address] [place], [place], [place]  [address] Phone: [phone]; Fax: [phone]

## 2016-09-28 ENCOUNTER — Ambulatory Visit (INDEPENDENT_AMBULATORY_CARE_PROVIDER_SITE_OTHER): Payer: Medicare Other | Admitting: Interventional Cardiology

## 2016-09-28 ENCOUNTER — Encounter: Payer: Self-pay | Admitting: Interventional Cardiology

## 2016-09-28 ENCOUNTER — Encounter (INDEPENDENT_AMBULATORY_CARE_PROVIDER_SITE_OTHER): Payer: Self-pay

## 2016-09-28 VITALS — BP 140/92 | HR 55 | Ht 72.0 in | Wt 199.4 lb

## 2016-09-28 DIAGNOSIS — I2510 Atherosclerotic heart disease of native coronary artery without angina pectoris: Secondary | ICD-10-CM | POA: Diagnosis not present

## 2016-09-28 DIAGNOSIS — I252 Old myocardial infarction: Secondary | ICD-10-CM

## 2016-09-28 DIAGNOSIS — F17200 Nicotine dependence, unspecified, uncomplicated: Secondary | ICD-10-CM | POA: Diagnosis not present

## 2016-09-28 DIAGNOSIS — E782 Mixed hyperlipidemia: Secondary | ICD-10-CM | POA: Insufficient documentation

## 2016-09-28 DIAGNOSIS — I10 Essential (primary) hypertension: Secondary | ICD-10-CM

## 2016-09-28 LAB — LIPID PANEL
CHOL/HDL RATIO: 3 ratio (ref 0.0–5.0)
Cholesterol, Total: 129 mg/dL (ref 100–199)
HDL: 43 mg/dL (ref >39)
LDL CALC: 69 mg/dL (ref 0–99)
TRIGLYCERIDES: 83 mg/dL (ref 0–149)
VLDL CHOLESTEROL CAL: 17 mg/dL (ref 5–40)

## 2016-09-28 LAB — COMPREHENSIVE METABOLIC PANEL
A/G RATIO: 1.5 (ref 1.2–2.2)
ALT: 31 IU/L (ref 0–44)
AST: 22 IU/L (ref 0–40)
Albumin: 4.1 g/dL (ref 3.6–4.8)
Alkaline Phosphatase: 82 IU/L (ref 39–117)
BUN/Creatinine Ratio: 5 — ABNORMAL LOW (ref 10–24)
BUN: 6 mg/dL — AB (ref 8–27)
Bilirubin Total: 0.3 mg/dL (ref 0.0–1.2)
CALCIUM: 9.4 mg/dL (ref 8.6–10.2)
CO2: 22 mmol/L (ref 20–29)
CREATININE: 1.22 mg/dL (ref 0.76–1.27)
Chloride: 105 mmol/L (ref 96–106)
GFR, EST AFRICAN AMERICAN: 74 mL/min/{1.73_m2} (ref >59)
GFR, EST NON AFRICAN AMERICAN: 64 mL/min/{1.73_m2} (ref >59)
Globulin, Total: 2.7 g/dL (ref 1.5–4.5)
Glucose: 109 mg/dL — ABNORMAL HIGH (ref 65–99)
Potassium: 4 mmol/L (ref 3.5–5.2)
Sodium: 141 mmol/L (ref 134–144)
TOTAL PROTEIN: 6.8 g/dL (ref 6.0–8.5)

## 2016-09-28 NOTE — Patient Instructions (Signed)
Medication Instructions:  Your physician recommends that you continue on your current medications as directed. Please refer to the Current Medication list given to you today.   Labwork: LABS TODAY: CMET, LIPIDS  Testing/Procedures: None ordered  Follow-Up: Your physician wants you to follow-up in: 1 year with Dr. Eldridge Dace. You will receive a reminder letter in the mail two months in advance. If you don't receive a letter, please call our office to schedule the follow-up appointment.   Any Other Special Instructions Will Be Listed Below (If Applicable).  [phone] QUIT NOW   If you need a refill on your cardiac medications before your next appointment, please call your pharmacy.

## 2016-10-20 ENCOUNTER — Other Ambulatory Visit: Payer: Self-pay | Admitting: Interventional Cardiology

## 2017-11-29 ENCOUNTER — Ambulatory Visit (INDEPENDENT_AMBULATORY_CARE_PROVIDER_SITE_OTHER): Payer: Medicare Other | Admitting: Interventional Cardiology

## 2017-11-29 ENCOUNTER — Encounter: Payer: Self-pay | Admitting: Interventional Cardiology

## 2017-11-29 VITALS — BP 132/90 | HR 72 | Ht 72.0 in | Wt 188.4 lb

## 2017-11-29 DIAGNOSIS — I10 Essential (primary) hypertension: Secondary | ICD-10-CM | POA: Diagnosis not present

## 2017-11-29 DIAGNOSIS — I2510 Atherosclerotic heart disease of native coronary artery without angina pectoris: Secondary | ICD-10-CM

## 2017-11-29 DIAGNOSIS — E782 Mixed hyperlipidemia: Secondary | ICD-10-CM | POA: Diagnosis not present

## 2017-11-29 DIAGNOSIS — I252 Old myocardial infarction: Secondary | ICD-10-CM

## 2017-11-29 DIAGNOSIS — F17200 Nicotine dependence, unspecified, uncomplicated: Secondary | ICD-10-CM

## 2017-11-29 MED ORDER — LISINOPRIL 10 MG PO TABS: ORAL_TABLET | ORAL | 3 refills | Status: AC

## 2017-11-29 MED ORDER — NITROGLYCERIN 0.4 MG SL SUBL: 0.4000 mg | SUBLINGUAL_TABLET | SUBLINGUAL | 5 refills | Status: AC | PRN

## 2017-11-29 MED ORDER — CLOPIDOGREL BISULFATE 75 MG PO TABS: ORAL_TABLET | ORAL | 3 refills | Status: AC

## 2017-11-29 MED ORDER — ATORVASTATIN CALCIUM 40 MG PO TABS: 40.0000 mg | ORAL_TABLET | Freq: Every day | ORAL | 3 refills | Status: DC

## 2017-11-29 MED ORDER — ASPIRIN EC 81 MG PO TBEC: 81.0000 mg | DELAYED_RELEASE_TABLET | Freq: Every day | ORAL | 3 refills | Status: AC

## 2017-11-29 MED ORDER — CARVEDILOL 3.125 MG PO TABS: 3.1250 mg | ORAL_TABLET | Freq: Two times a day (BID) | ORAL | 3 refills | Status: AC

## 2017-11-29 NOTE — Progress Notes (Signed)
Cardiology Office Note   Date:  11/29/2017   ID:  Scott Hobbs, DOB [birth year], MRN [id number]  PCP:  Ailene Ravel, MD    No chief complaint on file.  CAD  Wt Readings from Last 3 Encounters:  11/29/17 188 lb 6.4 oz (85.5 kg)  09/28/16 199 lb 6.4 oz (90.4 kg)  09/25/15 181 lb 1.9 oz (82.2 kg)       History of Present Illness: Scott Hobbs is a 62 y.o. male  who had an anterior MI in 9/ 2012 complicated by VF arrest. He received an LAD bare metal stent 3.0 x 23 mm at that time. Says a history of hypertension and tobacco abuse. At a nuclear stress test in 2013 which demonstrated possible mild basal paraseptal and mild inferior ischemia  His last echocardiogram also 2013 revealed normal left ventricular systolic function with estimated ejection fraction of 55-60%.  Since the last visit, he ash done well he walks 2 miles a day.  He still smokes.  He is unable to quit. He can stop for 2 weeks but then gets a craving, typically after eating.  He tried patches but went back to smoking.   Denies : Chest pain. Dizziness. Leg edema. Nitroglycerin use. Orthopnea. Palpitations. Paroxysmal nocturnal dyspnea. Shortness of breath. Syncope.   Past Medical History:  Diagnosis Date  . Acute myocardial infarction of other anterior wall, subsequent episode of care   . CAD (coronary artery disease)   . HTN (hypertension)   . Hyperlipidemia     Past Surgical History:  Procedure Laterality Date  . NO PAST SURGERIES       Current Outpatient Medications  Medication Sig Dispense Refill  . aspirin EC 81 MG tablet Take 1 tablet (81 mg total) by mouth daily. 90 tablet 3  . carvedilol (COREG) 3.125 MG tablet TAKE 1 TABLET TWICE A DAY (Patient taking differently: Take 3.125 mg by mouth 2 (two) times daily with a meal. TAKE 1 TABLET TWICE A DAY) 60 tablet 11  . clopidogrel (PLAVIX) 75 MG tablet TAKE 1 TABLET BY MOUTH EVERY DAY 30 tablet 11  . lisinopril (PRINIVIL,ZESTRIL) 10 MG tablet  TAKE 1 TABLET BY MOUTH EVERY DAY 30 tablet 11  . nitroGLYCERIN (NITROSTAT) 0.4 MG SL tablet Place 1 tablet (0.4 mg total) under the tongue every 5 (five) minutes as needed for chest pain. 25 tablet 5  . traZODone (DESYREL) 100 MG tablet TAKE ONE (1) TABLET AT BEDTIME  2  . atorvastatin (LIPITOR) 40 MG tablet TAKE 1 TABLET (40 MG TOTAL) BY MOUTH DAILY. 30 tablet 11   No current facility-administered medications for this visit.     Allergies:   Patient has no known allergies.    Social History:  The patient  reports that he has been smoking cigarettes. He has been smoking about 0.50 packs per day. He uses smokeless tobacco. He reports that he drinks alcohol. He reports that he has current or past drug history.   Family History:  The patient's family history includes Cancer in his mother; Diabetes in his father and sister; Heart Problems in his father; Hypertension in his father, sister, and sister; Stroke in his father.    ROS:  Please see the history of present illness.   Otherwise, review of systems are positive for unable to stop smoking.   All other systems are reviewed and negative.    PHYSICAL EXAM: VS:  BP 132/90   Pulse 72   Ht 6' (1.829 m)  Wt 188 lb 6.4 oz (85.5 kg)   SpO2 98%   BMI 25.55 kg/m  , BMI Body mass index is 25.55 kg/m. GEN: Well nourished, well developed, in no acute distress  HEENT: normal  Neck: no JVD, carotid bruits, or masses Cardiac: RRR; no murmurs, rubs, or gallops,no edema  Respiratory:  clear to auscultation bilaterally, normal work of breathing GI: soft, nontender, nondistended, + BS MS: no deformity or atrophy  Skin: warm and dry, no rash Neuro:  Strength and sensation are intact Psych: euthymic mood, full affect   EKG:   The ekg ordered today demonstrates NSR, nonspecific ST changes   Recent Labs: No results found for requested labs within last 8760 hours.   Lipid Panel    Component Value Date/Time   CHOL 129 09/28/2016 1002   TRIG  83 09/28/2016 1002   HDL 43 09/28/2016 1002   CHOLHDL 3.0 09/28/2016 1002   CHOLHDL 5.2 (H) 09/25/2015 1138   VLDL 20 09/25/2015 1138   LDLCALC 69 09/28/2016 1002     Other studies Reviewed: Additional studies/ records that were reviewed today with results demonstrating: Cath and echo results reviewed.  Normal left ventricular function after his MI.   ASSESSMENT AND PLAN:  1. CAD/Old MI: No angina on medical therapy.  Continue aggressive secondary prevention and regular exercise.  He tries to eat healthy as well. 2. HTN: The current medical regimen is effective;  continue present plan and medications. 3. Hyperlipidemia: Lipids need to be rechecked.  Continue current lipid-lowering therapy for now.  Will refill atorvastatin. 4. Tobacco abuse: We spoke about the importance of stopping smoking.  He is trying to quit on his own.  He is able to stop for up to a couple of weeks at a time, but then goes back to smoking.   Current medicines are reviewed at length with the patient today.  The patient concerns regarding his medicines were addressed.  The following changes have been made:  No change  Labs/ tests ordered today include:  No orders of the defined types were placed in this encounter.   Recommend 150 minutes/week of aerobic exercise Low fat, low carb, high fiber diet recommended  Disposition:   FU in 1 year   Signed, Lance Muss, MD  11/29/2017 2:09 PM    [facility] [address] [place], [place], [place]  [address] Phone: [phone]; Fax: [phone]

## 2017-11-29 NOTE — Patient Instructions (Signed)
Medication Instructions:  Your physician recommends that you continue on your current medications as directed. Please refer to the Current Medication list given to you today.  If you need a refill on your cardiac medications before your next appointment, please call your pharmacy.   Lab work: TODAY: CMET, LIPIDS  If you have labs (blood work) drawn today and your tests are completely normal, you will receive your results only by: . MyChart Message (if you have MyChart) OR . A paper copy in the mail If you have any lab test that is abnormal or we need to change your treatment, we will call you to review the results.  Testing/Procedures: None ordered  Follow-Up: At [facility], you and your health needs are our priority.  As part of our continuing mission to provide you with exceptional heart care, we have created designated Provider Care Teams.  These Care Teams include your primary Cardiologist (physician) and Advanced Practice Providers (APPs -  Physician Assistants and Nurse Practitioners) who all work together to provide you with the care you need, when you need it. . You will need a follow up appointment in 1 year.  Please call our office 2 months in advance to schedule this appointment.  You may see Jay Varanasi, MD or one of the following Advanced Practice Providers on your designated Care Team:   . Brittainy Simmons, PA-C . Dayna Dunn, PA-C . Michele Lenze, PA-C  Any Other Special Instructions Will Be Listed Below (If Applicable).    

## 2017-11-30 LAB — COMPREHENSIVE METABOLIC PANEL
ALBUMIN: 4.9 g/dL — AB (ref 3.6–4.8)
ALT: 20 IU/L (ref 0–44)
AST: 13 IU/L (ref 0–40)
Albumin/Globulin Ratio: 1.5 (ref 1.2–2.2)
Alkaline Phosphatase: 87 IU/L (ref 39–117)
BUN / CREAT RATIO: 5 — AB (ref 10–24)
BUN: 6 mg/dL — ABNORMAL LOW (ref 8–27)
Bilirubin Total: 0.3 mg/dL (ref 0.0–1.2)
CALCIUM: 10.2 mg/dL (ref 8.6–10.2)
CO2: 23 mmol/L (ref 20–29)
CREATININE: 1.18 mg/dL (ref 0.76–1.27)
Chloride: 101 mmol/L (ref 96–106)
GFR calc Af Amer: 77 mL/min/{1.73_m2} (ref >59)
GFR calc non Af Amer: 66 mL/min/{1.73_m2} (ref >59)
GLOBULIN, TOTAL: 3.2 g/dL (ref 1.5–4.5)
Glucose: 89 mg/dL (ref 65–99)
Potassium: 4.9 mmol/L (ref 3.5–5.2)
SODIUM: 139 mmol/L (ref 134–144)
TOTAL PROTEIN: 8.1 g/dL (ref 6.0–8.5)

## 2017-11-30 LAB — LIPID PANEL
CHOL/HDL RATIO: 5.3 ratio — AB (ref 0.0–5.0)
Cholesterol, Total: 216 mg/dL — ABNORMAL HIGH (ref 100–199)
HDL: 41 mg/dL (ref >39)
LDL CALC: 147 mg/dL — AB (ref 0–99)
TRIGLYCERIDES: 140 mg/dL (ref 0–149)
VLDL Cholesterol Cal: 28 mg/dL (ref 5–40)

## 2017-12-01 ENCOUNTER — Telehealth: Payer: Self-pay

## 2017-12-01 DIAGNOSIS — E782 Mixed hyperlipidemia: Secondary | ICD-10-CM

## 2017-12-01 NOTE — Telephone Encounter (Signed)
-----   Message from Corky Crafts, MD sent at 11/30/2017  4:12 PM EDT ----- Is he taking a statin?  OTher labs are ok.  He said he was only out of his meds for a few days.

## 2017-12-01 NOTE — Telephone Encounter (Signed)
Attempted to contact patient but there was no answer and VM not set up. Will try again at another time.  

## 2017-12-03 NOTE — Telephone Encounter (Signed)
Patient made aware of results. Patient states that he was out of his atorvastatin for a few weeks or maybe longer. He states that he restarted taking it yesterday. Repeat Lipids ordered for 02/28/18.

## 2018-02-28 ENCOUNTER — Encounter (INDEPENDENT_AMBULATORY_CARE_PROVIDER_SITE_OTHER): Payer: Self-pay

## 2018-02-28 ENCOUNTER — Other Ambulatory Visit: Payer: Medicare Other

## 2018-02-28 DIAGNOSIS — E782 Mixed hyperlipidemia: Secondary | ICD-10-CM

## 2018-03-01 LAB — LIPID PANEL
Chol/HDL Ratio: 6.1 ratio — ABNORMAL HIGH (ref 0.0–5.0)
Cholesterol, Total: 190 mg/dL (ref 100–199)
HDL: 31 mg/dL — ABNORMAL LOW (ref >39)
LDL Calculated: 112 mg/dL — ABNORMAL HIGH (ref 0–99)
Triglycerides: 237 mg/dL — ABNORMAL HIGH (ref 0–149)
VLDL Cholesterol Cal: 47 mg/dL — ABNORMAL HIGH (ref 5–40)

## 2018-03-02 ENCOUNTER — Telehealth: Payer: Self-pay

## 2018-03-02 DIAGNOSIS — I2510 Atherosclerotic heart disease of native coronary artery without angina pectoris: Secondary | ICD-10-CM

## 2018-03-02 DIAGNOSIS — E782 Mixed hyperlipidemia: Secondary | ICD-10-CM

## 2018-03-02 NOTE — Telephone Encounter (Signed)
Attempted to contact patient but there was no answer and VM did not pick up. Will try again at another time. 

## 2018-03-02 NOTE — Telephone Encounter (Signed)
-----   Message from Corky Crafts, MD sent at 03/02/2018  9:45 AM EST ----- Increase atorvastatin to 80 mg daily.  Recheck lipids, liver tests in 2 months.

## 2018-03-08 NOTE — Telephone Encounter (Signed)
Left message for patient to call back  

## 2018-03-10 MED ORDER — ATORVASTATIN CALCIUM 80 MG PO TABS: 80.0000 mg | ORAL_TABLET | Freq: Every day | ORAL | 3 refills | Status: AC

## 2018-03-10 NOTE — Telephone Encounter (Signed)
Called and made patient aware of results and recommendations to increase atorvastatin to 80 mg daily and recheck lipids, liver tests in 2 months. Patient verbalized understanding. Rx sent to preferred pharmacy. Fasting lab appointment made for 3/31.

## 2018-05-10 ENCOUNTER — Other Ambulatory Visit: Payer: Medicare Other

## 2018-07-26 ENCOUNTER — Other Ambulatory Visit: Payer: Medicare Other

## 2019-01-12 NOTE — Progress Notes (Deleted)
Cardiology Office Note   Date:  01/12/2019   ID:  Scott Hobbs, DOB [birth year], MRN [id number]  PCP:  Ailene Ravel, MD    No chief complaint on file.  CAD  Wt Readings from Last 3 Encounters:  11/29/17 188 lb 6.4 oz (85.5 kg)  09/28/16 199 lb 6.4 oz (90.4 kg)  09/25/15 181 lb 1.9 oz (82.2 kg)       History of Present Illness: Scott Hobbs is a 63 y.o. 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 He received an LAD bare metal stent 3.0 x 23 mm at that time. Says a history of hypertension and tobacco abuse. At a nuclear stress test in 2013 which demonstrated possible mild basal paraseptal and mild inferior ischemia  His last echocardiogram also 2013 revealed normal left ventricularsystolic function with estimated ejection fraction of 55-60%.  In the past, he walked 2 miles a day.   He was unable to  smoking. He can stop for 2 weeks but then gets a craving, typically after eating.  He tried patches but went back to smoking.     Past Medical History:  Diagnosis Date  . Acute myocardial infarction of other anterior wall, subsequent episode of care   . CAD (coronary artery disease)   . HTN (hypertension)   . Hyperlipidemia     Past Surgical History:  Procedure Laterality Date  . NO PAST SURGERIES       Current Outpatient Medications  Medication Sig Dispense Refill  . aspirin EC 81 MG tablet Take 1 tablet (81 mg total) by mouth daily. 90 tablet 3  . atorvastatin (LIPITOR) 80 MG tablet Take 1 tablet (80 mg total) by mouth daily. 90 tablet 3  . carvedilol (COREG) 3.125 MG tablet Take 1 tablet (3.125 mg total) by mouth 2 (two) times daily with a meal. 180 tablet 3  . clopidogrel (PLAVIX) 75 MG tablet TAKE 1 TABLET BY MOUTH EVERY DAY 90 tablet 3  . lisinopril (PRINIVIL,ZESTRIL) 10 MG tablet TAKE 1 TABLET BY MOUTH EVERY DAY 90 tablet 3  . nitroGLYCERIN (NITROSTAT) 0.4 MG SL tablet Place 1 tablet (0.4 mg total) under the tongue every 5 (five)  minutes as needed for chest pain. 25 tablet 5  . traZODone (DESYREL) 100 MG tablet TAKE ONE (1) TABLET AT BEDTIME  2   No current facility-administered medications for this visit.     Allergies:   Patient has no known allergies.    Social History:  The patient  reports that he has been smoking cigarettes. He has been smoking about 0.50 packs per day. He uses smokeless tobacco. He reports current alcohol use. He reports current drug use.   Family History:  The patient's ***family history includes Cancer in his mother; Diabetes in his father and sister; Heart Problems in his father; Hypertension in his father, sister, and sister; Stroke in his father.    ROS:  Please see the history of present illness.   Otherwise, review of systems are positive for ***.   All other systems are reviewed and negative.    PHYSICAL EXAM: VS:  There were no vitals taken for this visit. , BMI There is no height or weight on file to calculate BMI. GEN: Well nourished, well developed, in no acute distress HEENT: normal Neck: no JVD, carotid bruits, or masses Cardiac: ***RRR; no murmurs, rubs, or gallops,no edema  Respiratory:  clear to auscultation bilaterally, normal work of breathing GI:  soft, nontender, nondistended, + BS MS: no deformity or atrophy Skin: warm and dry, no rash Neuro:  Strength and sensation are intact Psych: euthymic mood, full affect   EKG:   The ekg ordered today demonstrates ***   Recent Labs: No results found for requested labs within last 8760 hours.   Lipid Panel    Component Value Date/Time   CHOL 190 02/28/2018 1537   TRIG 237 (H) 02/28/2018 1537   HDL 31 (L) 02/28/2018 1537   CHOLHDL 6.1 (H) 02/28/2018 1537   CHOLHDL 5.2 (H) 09/25/2015 1138   VLDL 20 09/25/2015 1138   LDLCALC 112 (H) 02/28/2018 1537     Other studies Reviewed: Additional studies/ records that were reviewed today with results demonstrating: ***.   ASSESSMENT AND PLAN:  1. CAD/Old MI: 2. HTN:  3. Hyperlipidemia: 4. Tobacco abuse:   Current medicines are reviewed at length with the patient today.  The patient concerns regarding his medicines were addressed.  The following changes have been made:  No change***  Labs/ tests ordered today include: *** No orders of the defined types were placed in this encounter.   Recommend 150 minutes/week of aerobic exercise Low fat, low carb, high fiber diet recommended  Disposition:   FU in ***   Signed, Larae Grooms, MD  01/12/2019 5:10 PM    [facility] [place], [place], [place]  [address] Phone: [phone]; Fax: [phone]

## 2019-01-13 ENCOUNTER — Ambulatory Visit: Payer: Medicare Other | Admitting: Interventional Cardiology

## 2019-06-28 NOTE — Progress Notes (Signed)
Cardiology Office Note   Date:  06/29/2019   ID:  Scott Hobbs, DOB 10-10-1955, MRN [id number]  PCP:  Leonides Sake, MD    No chief complaint on file.  CAD/old MI  Wt Readings from Last 3 Encounters:  06/29/19 174 lb 3.2 oz (79 kg)  11/29/17 188 lb 6.4 oz (85.5 kg)  09/28/16 199 lb 6.4 oz (90.4 kg)       History of Present Illness: Scott Hobbs is a 65 y.o. male  who had an anterior MI in 9/ 6314 complicated by VF arrest. He received an LAD bare metal stent 3.0 x 23 mm at that time. Says a history of hypertension and tobacco abuse. At a nuclear stress test in 2013 which demonstrated possible mild basal paraseptal and mild inferior ischemia  His last echocardiogram also 2013 revealed normal left ventricularsystolic function with estimated ejection fraction of 55-60%.  Since the last visit, he has felt well physically.  He does report poor sleep and hearing voices. He has bad dreams.   He was getting some counseling and getting some kind of injection.  He did not feel that it was helping.   Denies : Chest pain. Dizziness. Leg edema. Nitroglycerin use. Orthopnea. Palpitations. Paroxysmal nocturnal dyspnea. Shortness of breath. Syncope.   Walking 10 miles a day.  Still smoking.   He has not gotten his COVID shots, but he stays away from people in general.    Past Medical History:  Diagnosis Date  . Acute myocardial infarction of other anterior wall, subsequent episode of care   . CAD (coronary artery disease)   . HTN (hypertension)   . Hyperlipidemia     Past Surgical History:  Procedure Laterality Date  . NO PAST SURGERIES       Current Outpatient Medications  Medication Sig Dispense Refill  . aspirin EC 81 MG tablet Take 1 tablet (81 mg total) by mouth daily. 90 tablet 3  . atorvastatin (LIPITOR) 80 MG tablet Take 1 tablet (80 mg total) by mouth daily. 90 tablet 3  . carvedilol (COREG) 3.125 MG tablet Take 1 tablet (3.125 mg total) by mouth 2 (two) times  daily with a meal. 180 tablet 3  . clopidogrel (PLAVIX) 75 MG tablet TAKE 1 TABLET BY MOUTH EVERY DAY 90 tablet 3  . lisinopril (PRINIVIL,ZESTRIL) 10 MG tablet TAKE 1 TABLET BY MOUTH EVERY DAY 90 tablet 3  . nitroGLYCERIN (NITROSTAT) 0.4 MG SL tablet Place 1 tablet (0.4 mg total) under the tongue every 5 (five) minutes as needed for chest pain. 25 tablet 5  . traZODone (DESYREL) 100 MG tablet TAKE ONE (1) TABLET AT BEDTIME  2   No current facility-administered medications for this visit.    Allergies:   Patient has no known allergies.    Social History:  The patient  reports that he has been smoking cigarettes. He has been smoking about 0.50 packs per day. He uses smokeless tobacco. He reports current alcohol use. He reports current drug use.   Family History:  The patient's family history includes Cancer in his mother; Diabetes in his father and sister; Heart Problems in his father; Hypertension in his father, sister, and sister; Stroke in his father.    ROS:  Please see the history of present illness.   Otherwise, review of systems are positive for inability to quit smoking.   All other systems are reviewed and negative.    PHYSICAL EXAM: VS:  BP 138/88   Pulse 67  Ht 6' (1.829 m)   Wt 174 lb 3.2 oz (79 kg)   SpO2 97%   BMI 23.63 kg/m  , BMI Body mass index is 23.63 kg/m. GEN: Well nourished, well developed, in no acute distress  HEENT: normal  Neck: no JVD, carotid bruits, or masses Cardiac: RRR; no murmurs, rubs, or gallops,no edema  Respiratory:  clear to auscultation bilaterally, normal work of breathing GI: soft, nontender, nondistended, + BS MS: no deformity or atrophy  Skin: warm and dry, no rash Neuro:  Strength and sensation are intact Psych: euthymic mood, full affect   EKG:   The ekg ordered today demonstrates NSR, no ST changes   Recent Labs: No results found for requested labs within last 8760 hours.   Lipid Panel    Component Value Date/Time   CHOL  190 02/28/2018 1537   TRIG 237 (H) 02/28/2018 1537   HDL 31 (L) 02/28/2018 1537   CHOLHDL 6.1 (H) 02/28/2018 1537   CHOLHDL 5.2 (H) 09/25/2015 1138   VLDL 20 09/25/2015 1138   LDLCALC 112 (H) 02/28/2018 1537     Other studies Reviewed: Additional studies/ records that were reviewed today with results demonstrating: 9/20 labs from [facility] reviewed.   ASSESSMENT AND PLAN:  1. CAD/old MI: No angina. COntinue aggressive secondary prevention.  Stop smoking.  2. Hypertension: The current medical regimen is effective;  continue present plan and medications. 3. Hyperlipidemia: Check lipids today. 4. Tobacco abuse: Unable to stop smoking.  We discussed trying a nicotine patch. He will try again cold [place]. I would not use Chantix given his psych issues.  5. He will go to local [facility] to get his vaccine shots.   Current medicines are reviewed at length with the patient today.  The patient concerns regarding his medicines were addressed.  The following changes have been made:  No change  Labs/ tests ordered today include:  No orders of the defined types were placed in this encounter.   Recommend 150 minutes/week of aerobic exercise Low fat, low carb, high fiber diet recommended  Disposition:   FU in 1 year   Signed, Lance Muss, MD  06/29/2019 10:07 AM    [facility] [address] [place], [place], [place]  [address] Phone: [phone]; Fax: [phone]

## 2019-06-29 ENCOUNTER — Ambulatory Visit (INDEPENDENT_AMBULATORY_CARE_PROVIDER_SITE_OTHER): Payer: Medicare Other | Admitting: Interventional Cardiology

## 2019-06-29 ENCOUNTER — Encounter: Payer: Self-pay | Admitting: Interventional Cardiology

## 2019-06-29 ENCOUNTER — Other Ambulatory Visit: Payer: Self-pay

## 2019-06-29 VITALS — BP 138/88 | HR 67 | Ht 72.0 in | Wt 174.2 lb

## 2019-06-29 DIAGNOSIS — F17200 Nicotine dependence, unspecified, uncomplicated: Secondary | ICD-10-CM

## 2019-06-29 DIAGNOSIS — I10 Essential (primary) hypertension: Secondary | ICD-10-CM | POA: Diagnosis not present

## 2019-06-29 DIAGNOSIS — I252 Old myocardial infarction: Secondary | ICD-10-CM | POA: Diagnosis not present

## 2019-06-29 DIAGNOSIS — I2510 Atherosclerotic heart disease of native coronary artery without angina pectoris: Secondary | ICD-10-CM

## 2019-06-29 DIAGNOSIS — E782 Mixed hyperlipidemia: Secondary | ICD-10-CM

## 2019-06-29 LAB — LIPID PANEL
Chol/HDL Ratio: 5 ratio (ref 0.0–5.0)
Cholesterol, Total: 211 mg/dL — ABNORMAL HIGH (ref 100–199)
HDL: 42 mg/dL (ref >39)
LDL Chol Calc (NIH): 150 mg/dL — ABNORMAL HIGH (ref 0–99)
Triglycerides: 105 mg/dL (ref 0–149)
VLDL Cholesterol Cal: 19 mg/dL (ref 5–40)

## 2019-06-29 LAB — COMPREHENSIVE METABOLIC PANEL
ALT: 17 IU/L (ref 0–44)
AST: 16 IU/L (ref 0–40)
Albumin/Globulin Ratio: 1.5 (ref 1.2–2.2)
Albumin: 4.5 g/dL (ref 3.8–4.8)
Alkaline Phosphatase: 83 IU/L (ref 48–121)
BUN/Creatinine Ratio: 6 — ABNORMAL LOW (ref 10–24)
BUN: 7 mg/dL — ABNORMAL LOW (ref 8–27)
Bilirubin Total: <0.2 mg/dL (ref 0.0–1.2)
CO2: 21 mmol/L (ref 20–29)
Calcium: 9.7 mg/dL (ref 8.6–10.2)
Chloride: 99 mmol/L (ref 96–106)
Creatinine, Ser: 1.12 mg/dL (ref 0.76–1.27)
GFR calc Af Amer: 81 mL/min/{1.73_m2} (ref >59)
GFR calc non Af Amer: 70 mL/min/{1.73_m2} (ref >59)
Globulin, Total: 3.1 g/dL (ref 1.5–4.5)
Glucose: 120 mg/dL — ABNORMAL HIGH (ref 65–99)
Potassium: 3.9 mmol/L (ref 3.5–5.2)
Sodium: 136 mmol/L (ref 134–144)
Total Protein: 7.6 g/dL (ref 6.0–8.5)

## 2019-06-29 LAB — CBC
Hematocrit: 44.5 % (ref 37.5–51.0)
Hemoglobin: 15.3 g/dL (ref 13.0–17.7)
MCH: 30.1 pg (ref 26.6–33.0)
MCHC: 34.4 g/dL (ref 31.5–35.7)
MCV: 87 fL (ref 79–97)
Platelets: 316 10*3/uL (ref 150–450)
RBC: 5.09 x10E6/uL (ref 4.14–5.80)
RDW: 13.1 % (ref 11.6–15.4)
WBC: 8.3 10*3/uL (ref 3.4–10.8)

## 2019-06-29 NOTE — Patient Instructions (Signed)
Medication Instructions:  Your physician recommends that you continue on your current medications as directed. Please refer to the Current Medication list given to you today.  *If you need a refill on your cardiac medications before your next appointment, please call your pharmacy*   Lab Work: TODAY: CMET, CBC, LIPIDS  If you have labs (blood work) drawn today and your tests are completely normal, you will receive your results only by: Marland Kitchen MyChart Message (if you have MyChart) OR . A paper copy in the mail If you have any lab test that is abnormal or we need to change your treatment, we will call you to review the results.   Testing/Procedures: None ordered   Follow-Up: At [facility], you and your health needs are our priority.  As part of our continuing mission to provide you with exceptional heart care, we have created designated Provider Care Teams.  These Care Teams include your primary Cardiologist (physician) and Advanced Practice Providers (APPs -  Physician Assistants and Nurse Practitioners) who all work together to provide you with the care you need, when you need it.  We recommend signing up for the patient portal called "MyChart".  Sign up information is provided on this After Visit Summary.  MyChart is used to connect with patients for Virtual Visits (Telemedicine).  Patients are able to view lab/test results, encounter notes, upcoming appointments, etc.  Non-urgent messages can be sent to your provider as well.   To learn more about what you can do with MyChart, go to [email or web address].    Your next appointment:   12 month(s)  The format for your next appointment:   In Person  Provider:   You may see Lance Muss, MD or one of the following Advanced Practice Providers on your designated Care Team:    Ronie Spies, PA-C  Jacolyn Reedy, PA-C    Other Instructions None

## 2023-12-10 NOTE — Progress Notes (Deleted)
    Cardiology Office Note Date:  12/10/2023  ID:  Scott Hobbs, DOB 08-20-1955, MRN [id number] PCP:  Stephanie Charlene CROME, MD  Cardiologist:  Joelle VEAR Ren Donley, MD  No chief complaint on file.     Problems CAD c/b anterior MI (10/2010) c/b VF arrest s/p MS HTN/HLD Tobacco use M: ASA81, AN80, CL3.125, CL75, LL10  Visits  11/25:    History of Present Illness: Scott Hobbs is a 68 y.o. male who presents to establish care.   ROS: Please see the history of present illness. All other systems are reviewed and negative.   Past Medical History:  Diagnosis Date   Acute myocardial infarction of other anterior wall, subsequent episode of care    CAD (coronary artery disease)    HTN (hypertension)    Hyperlipidemia     Past Surgical History:  Procedure Laterality Date   NO PAST SURGERIES      Current Outpatient Medications  Medication Sig Dispense Refill   aspirin  EC 81 MG tablet Take 1 tablet (81 mg total) by mouth daily. 90 tablet 3   atorvastatin  (LIPITOR) 80 MG tablet Take 1 tablet (80 mg total) by mouth daily. 90 tablet 3   carvedilol  (COREG ) 3.125 MG tablet Take 1 tablet (3.125 mg total) by mouth 2 (two) times daily with a meal. 180 tablet 3   clopidogrel  (PLAVIX ) 75 MG tablet TAKE 1 TABLET BY MOUTH EVERY DAY 90 tablet 3   lisinopril  (PRINIVIL ,ZESTRIL ) 10 MG tablet TAKE 1 TABLET BY MOUTH EVERY DAY 90 tablet 3   nitroGLYCERIN  (NITROSTAT ) 0.4 MG SL tablet Place 1 tablet (0.4 mg total) under the tongue every 5 (five) minutes as needed for chest pain. 25 tablet 5   traZODone (DESYREL) 100 MG tablet TAKE ONE (1) TABLET AT BEDTIME  2   No current facility-administered medications for this visit.    Allergies:   Patient has no known allergies.   Social History:  see above  Family History:  see above  PHYSICAL EXAM: VS:  There were no vitals taken for this visit. , BMI There is no height or weight on file to calculate BMI. GEN: Well nourished, well developed, in no acute  distress HEENT: normal Neck: no JVD, carotid bruits, or masses Cardiac: ***RRR; no murmurs, rubs, or gallops,no edema  Respiratory:  CTAB bilaterally, normal work of breathing GI: soft, nontender, nondistended, + BS Extremities: No LE edema Skin: warm and dry, no rash Neuro:  Strength and sensation are intact  EKG: ***  Recent Labs: Reviewed  Studies: Reviewed  ASSESSMENT AND PLAN: Scott Hobbs is a 68 y.o. male who presents to establish care.    Signed, Joelle VEAR Ren Donley, MD  12/10/2023 2:56 PM    [facility]

## 2023-12-14 ENCOUNTER — Ambulatory Visit
